# Patient Record
Sex: Female | Born: 1995 | Race: Black or African American | Hispanic: No | Marital: Single | Smoking: Never smoker
Health system: Southern US, Community
[De-identification: ages and names within clinical notes are randomized; demographics above are authoritative.]

## PROBLEM LIST (undated history)

## (undated) DIAGNOSIS — D573 Sickle-cell trait: Secondary | ICD-10-CM

## (undated) DIAGNOSIS — R569 Unspecified convulsions: Secondary | ICD-10-CM

## (undated) DIAGNOSIS — I10 Essential (primary) hypertension: Secondary | ICD-10-CM

## (undated) HISTORY — DX: Sickle-cell trait: D57.3

---
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
## 2015-01-04 ENCOUNTER — Ambulatory Visit (INDEPENDENT_AMBULATORY_CARE_PROVIDER_SITE_OTHER): Payer: Medicaid Other

## 2015-01-04 DIAGNOSIS — Z111 Encounter for screening for respiratory tuberculosis: Secondary | ICD-10-CM

## 2015-01-04 NOTE — Progress Notes (Signed)
Patient here today for TB skin test for work. PPD placed in right fore arm. Patient to return to clinic Friday 01/06/15 at 0800 to have it read.

## 2015-01-05 ENCOUNTER — Ambulatory Visit: Payer: Self-pay

## 2015-01-06 NOTE — Progress Notes (Signed)
Pt came in for PPD reading.  R forearm resulted with 0 mm induration, negative.   Filled out pt record, made copy, and placed in misc records to be scanned into EPIC.

## 2015-06-08 ENCOUNTER — Encounter: Payer: Self-pay | Admitting: *Deleted

## 2017-04-08 ENCOUNTER — Encounter (HOSPITAL_COMMUNITY): Payer: Self-pay | Admitting: *Deleted

## 2017-04-08 ENCOUNTER — Ambulatory Visit (HOSPITAL_COMMUNITY)
Admission: EM | Admit: 2017-04-08 | Discharge: 2017-04-08 | Disposition: A | Discharge status: Home / Self Care | Payer: Self-pay | Attending: Family Medicine | Admitting: Family Medicine

## 2017-04-08 DIAGNOSIS — D573 Sickle-cell trait: Secondary | ICD-10-CM | POA: Insufficient documentation

## 2017-04-08 DIAGNOSIS — Z79899 Other long term (current) drug therapy: Secondary | ICD-10-CM | POA: Insufficient documentation

## 2017-04-08 DIAGNOSIS — R109 Unspecified abdominal pain: Secondary | ICD-10-CM | POA: Insufficient documentation

## 2017-04-08 DIAGNOSIS — N898 Other specified noninflammatory disorders of vagina: Secondary | ICD-10-CM | POA: Insufficient documentation

## 2017-04-08 NOTE — Discharge Instructions (Signed)
No alarming symptoms on your exam. Take ibuprofen 800mg  three times a day or Naproxen 440mg  twice a day if experiencing cramping. Follow up with GYN for further evaluation and treatment for your abdominal cramps/nexplanon implant. Cytology sent, you will be contacted with any positive results that requires further treatment. Refrain from sexual activity for the next 7 days. Monitor for any worsening of symptoms, fever, abdominal pain, nausea, vomiting, to follow up for reevaluation.

## 2017-04-08 NOTE — ED Provider Notes (Signed)
[facility]    CSN: [id number] Arrival date & time: 04/08/17  1304     History   Chief Complaint Chief Complaint  Patient presents with  . Vaginal Discharge    HPI Valerie Brooks is a 21 y.o. female.   21 year old female with 8 day history of vaginal discharge. Patient states discharge has been alternating between white and yellow, with occasional odor. Denies vaginal itching/pain, spotting. Patient states she had similar symptoms in the past, will and was associated with hormone change with birth control. Denies urinary symptoms such as dysuria, hematuria, frequency. Patient states she has had intermittent abdominal pain that is associated with her cycles. She has been taking ibuprofen for the cramping with good control. LMP 02/01/2017. She has a nexplanon implant, and is due for extraction soon. She denies current abdominal pain, nausea, vomiting, diarrhea, constipation. She is not currently sexually active, last sexual activity a few months ago. At that time, one partner, some condom use. Denies fever, chills, night sweats.       Past Medical History:  Diagnosis Date  . Seizures (HCC)   . Sickle cell trait [facility])     Patient Active Problem List   Diagnosis Date Noted  . Active labor 07/14/2014  . Vaginal delivery 07/14/2014  . Spontaneous vaginal delivery 07/14/2014  . Insufficient weight gain during pregnancy in third trimester, antepartum   . Encounter for fetal anatomic survey   . Uterine size-date discrepancy in third trimester, antepartum 07/11/2014  . Hemoglobin S (Hb-S) trait (HCC) 06/07/2014  . Asymptomatic bacteriuria during pregnancy in third trimester 06/07/2014  . Supervision of normal pregnancy in first trimester 05/30/2014  . Short cervix affecting pregnancy 05/30/2014  . Late prenatal care 05/30/2014  . Teen pregnancy 05/30/2014    History reviewed. No pertinent surgical history.  OB History    Gravida Para Term Preterm AB Living   1  1 1     1    SAB TAB Ectopic Multiple Live Births         0 1       Home Medications    Prior to Admission medications   Medication Sig Start Date End Date Taking? Authorizing Provider  etonogestrel (NEXPLANON) 68 MG IMPL implant 1 each by Subdermal route once. Inserted 07/15/14    [provider]  ibuprofen (ADVIL,MOTRIN) 600 MG tablet Take 1 tablet (600 mg total) by mouth every 6 (six) hours. Patient not taking: Reported on 08/25/2014 07/16/14   Arabella Merles, CNM  Prenatal Vit-Fe Fumarate-FA (PRENATAL MULTIVITAMIN) TABS tablet Take 1 tablet by mouth daily at 12 noon.    [provider]    Family History Family History  Problem Relation Age of Onset  . Sickle cell trait Mother   . Sickle cell trait Sister     Social History Social History  Substance Use Topics  . Smoking status: Never Smoker  . Smokeless tobacco: Never Used  . Alcohol use No     Allergies   Other   Review of Systems Review of Systems  Reason unable to perform ROS: See HPI as above.     Physical Exam Triage Vital Signs ED Triage Vitals [04/08/17 1404]  Enc Vitals Group     BP 123/84     Pulse Rate 78     Resp 14     Temp 98.3 F (36.8 C)     Temp Source Oral     SpO2 100 %     Weight  Height      Head Circumference      Peak Flow      Pain Score      Pain Loc      Pain Edu?      Excl. in GC?    No data found.   Updated Vital Signs BP 123/84 (BP Location: Right Arm)   Pulse 78   Temp 98.3 F (36.8 C) (Oral)   Resp 14   LMP 02/01/2017   SpO2 100%   Breastfeeding? No Comment: irreg  periods   since  implant  Visual Acuity Right Eye Distance:   Left Eye Distance:   Bilateral Distance:    Right Eye Near:   Left Eye Near:    Bilateral Near:     Physical Exam  Constitutional: She is oriented to person, place, and time. She appears well-developed and well-nourished. No distress.  HENT:  Head: Normocephalic and atraumatic.  Eyes: Pupils are equal,  round, and reactive to light. Conjunctivae are normal.  Cardiovascular: Normal rate, regular rhythm and normal heart sounds.  Exam reveals no gallop and no friction rub.   No murmur heard. Pulmonary/Chest: Effort normal and breath sounds normal. She has no wheezes. She has no rales.  Abdominal: Soft. Bowel sounds are normal. She exhibits no mass. There is no tenderness. There is no rebound, no guarding and no CVA tenderness.  Genitourinary:  Genitourinary Comments: Deferred. Patient self swabbed for cytology.   Neurological: She is alert and oriented to person, place, and time.  Skin: Skin is warm and dry.  Psychiatric: She has a normal mood and affect. Her behavior is normal. Judgment normal.     UC Treatments / Results  Labs (all labs ordered are listed, but only abnormal results are displayed) Labs Reviewed  CERVICOVAGINAL ANCILLARY ONLY    EKG  EKG Interpretation None       Radiology No results found.  Procedures Procedures (including critical care time)  Medications Ordered in UC Medications - No data to display   Initial Impression / Assessment and Plan / UC Course  I have reviewed the triage vital signs and the nursing notes.  Pertinent labs & imaging results that were available during my care of the patient were reviewed by me and considered in my medical decision making (see chart for details).    Discussed with patient possible causes of vaginal discharge, such as infection, irritation, changes in hormone. Given without alarming signs on exam, will await cytology results for treatment. Cytology sent, patient will be contacted with any positive results that require additional treatment. Patient to refrain from sexual activity for the next 7 days. Patient to follow up with GYN for further evaluation and treatment of abdominal cramping with cycles, and birth control use. Return precautions given.   Final Clinical Impressions(s) / UC Diagnoses   Final diagnoses:    Vaginal discharge    New Prescriptions New Prescriptions   No medications on file       Lurline IdolYu, Amy V, PA-C 04/08/17 1448

## 2017-04-08 NOTE — ED Triage Notes (Signed)
Pt     Reports    Vaginal  Discharge   X  8  Days  Denies   Any  Symptoms     Pt   Denies  Any   Pain

## 2017-04-10 ENCOUNTER — Telehealth (HOSPITAL_COMMUNITY): Payer: Self-pay | Admitting: Internal Medicine

## 2017-04-10 LAB — CERVICOVAGINAL ANCILLARY ONLY
Bacterial vaginitis: POSITIVE — AB
CANDIDA VAGINITIS: NEGATIVE
CHLAMYDIA, DNA PROBE: NEGATIVE
Neisseria Gonorrhea: NEGATIVE
Trichomonas: NEGATIVE

## 2017-04-10 NOTE — Telephone Encounter (Signed)
Please let patient know that test for gardnerella (bacterial vaginosis) was positive.  This only needs to be treated if there are symptoms, such as vaginal irritation/discharge.  If these symptoms are present, ok to send rx for metronidazole 500mg bid x 7d #14 no refills or metronidazole vaginal gel 0.75% 1 applicatorful bid x 7d #14 no refills. Recheck for further evaluation if symptoms are not improving.  LM   

## 2017-04-14 ENCOUNTER — Encounter: Payer: Self-pay | Admitting: Family Medicine

## 2017-04-14 ENCOUNTER — Other Ambulatory Visit: Payer: Self-pay | Admitting: General Practice

## 2017-04-14 DIAGNOSIS — B9689 Other specified bacterial agents as the cause of diseases classified elsewhere: Secondary | ICD-10-CM

## 2017-04-14 DIAGNOSIS — N760 Acute vaginitis: Principal | ICD-10-CM

## 2017-04-14 MED ORDER — METRONIDAZOLE 500 MG PO TABS: 500.0000 mg | ORAL_TABLET | Freq: Two times a day (BID) | ORAL | 0 refills | Status: DC

## 2017-04-25 ENCOUNTER — Encounter: Payer: Self-pay | Admitting: Family Medicine

## 2017-04-25 ENCOUNTER — Ambulatory Visit (INDEPENDENT_AMBULATORY_CARE_PROVIDER_SITE_OTHER): Payer: Self-pay | Admitting: Family Medicine

## 2017-04-25 VITALS — BP 136/84 | HR 76 | Ht 60.0 in | Wt 96.4 lb

## 2017-04-25 DIAGNOSIS — R102 Pelvic and perineal pain: Secondary | ICD-10-CM

## 2017-04-25 NOTE — Progress Notes (Signed)
Valerie Brooks wants to get nexplanon removed because she thinks it is causing her pelvic pain. States has monthly pain , not always a monthly period and it is is getting worse each month.Denies any pain today   States nexplanon due to come out in December anyways. Wants to start depo. We discussed the costs for each and she states she is getting [employer] next week and she elected to reschedule after her insurance starts.

## 2017-05-01 ENCOUNTER — Encounter: Payer: Self-pay | Admitting: General Practice

## 2017-05-01 ENCOUNTER — Ambulatory Visit: Payer: Self-pay | Admitting: Family Medicine

## 2017-05-01 NOTE — Progress Notes (Unsigned)
Patient no showed for appt today. Per Dr Adrian Blackwater, patient can reschedule on her own accord

## 2017-06-06 ENCOUNTER — Encounter: Payer: Self-pay | Admitting: Family Medicine

## 2018-06-17 ENCOUNTER — Other Ambulatory Visit: Payer: Self-pay | Admitting: Obstetrics and Gynecology

## 2018-07-17 ENCOUNTER — Ambulatory Visit (HOSPITAL_BASED_OUTPATIENT_CLINIC_OR_DEPARTMENT_OTHER): Admit: 2018-07-17 | Payer: Self-pay | Admitting: Obstetrics and Gynecology

## 2018-07-17 ENCOUNTER — Encounter (HOSPITAL_BASED_OUTPATIENT_CLINIC_OR_DEPARTMENT_OTHER): Payer: Self-pay

## 2018-07-17 SURGERY — LAPAROSCOPY, DIAGNOSTIC
Anesthesia: Choice

## 2018-09-19 ENCOUNTER — Emergency Department (HOSPITAL_COMMUNITY): Payer: Self-pay

## 2018-09-19 ENCOUNTER — Encounter (HOSPITAL_COMMUNITY): Payer: Self-pay

## 2018-09-19 ENCOUNTER — Emergency Department (HOSPITAL_COMMUNITY)
Admission: EM | Admit: 2018-09-19 | Discharge: 2018-09-19 | Disposition: A | Discharge status: Home / Self Care | Payer: Self-pay | Attending: Emergency Medicine | Admitting: Emergency Medicine

## 2018-09-19 DIAGNOSIS — R202 Paresthesia of skin: Secondary | ICD-10-CM | POA: Insufficient documentation

## 2018-09-19 DIAGNOSIS — R1030 Lower abdominal pain, unspecified: Secondary | ICD-10-CM | POA: Insufficient documentation

## 2018-09-19 DIAGNOSIS — Z79899 Other long term (current) drug therapy: Secondary | ICD-10-CM | POA: Insufficient documentation

## 2018-09-19 LAB — COMPREHENSIVE METABOLIC PANEL
ALK PHOS: 52 U/L (ref 38–126)
ALT: 17 U/L (ref 0–44)
AST: 23 U/L (ref 15–41)
Albumin: 4.8 g/dL (ref 3.5–5.0)
Anion gap: 11 (ref 5–15)
BILIRUBIN TOTAL: 0.6 mg/dL (ref 0.3–1.2)
BUN: 9 mg/dL (ref 6–20)
CALCIUM: 9.8 mg/dL (ref 8.9–10.3)
CO2: 24 mmol/L (ref 22–32)
Chloride: 102 mmol/L (ref 98–111)
Creatinine, Ser: 0.78 mg/dL (ref 0.44–1.00)
GFR calc non Af Amer: >60 mL/min (ref >60)
Glucose, Bld: 90 mg/dL (ref 70–99)
Potassium: 3.5 mmol/L (ref 3.5–5.1)
Sodium: 137 mmol/L (ref 135–145)
TOTAL PROTEIN: 8.2 g/dL — AB (ref 6.5–8.1)

## 2018-09-19 LAB — CBC WITH DIFFERENTIAL/PLATELET
Abs Immature Granulocytes: 0 10*3/uL (ref 0.00–0.07)
BASOS ABS: 0 10*3/uL (ref 0.0–0.1)
Basophils Relative: 1 %
EOS ABS: 0.1 10*3/uL (ref 0.0–0.5)
Eosinophils Relative: 2 %
HEMATOCRIT: 41.2 % (ref 36.0–46.0)
Hemoglobin: 13.6 g/dL (ref 12.0–15.0)
IMMATURE GRANULOCYTES: 0 %
LYMPHS ABS: 3.1 10*3/uL (ref 0.7–4.0)
LYMPHS PCT: 51 %
MCH: 26.2 pg (ref 26.0–34.0)
MCHC: 33 g/dL (ref 30.0–36.0)
MCV: 79.4 fL — ABNORMAL LOW (ref 80.0–100.0)
Monocytes Absolute: 0.4 10*3/uL (ref 0.1–1.0)
Monocytes Relative: 7 %
NRBC: 0 % (ref 0.0–0.2)
Neutro Abs: 2.3 10*3/uL (ref 1.7–7.7)
Neutrophils Relative %: 39 %
Platelets: 283 10*3/uL (ref 150–400)
RBC: 5.19 MIL/uL — ABNORMAL HIGH (ref 3.87–5.11)
RDW: 13.3 % (ref 11.5–15.5)
WBC: 5.9 10*3/uL (ref 4.0–10.5)

## 2018-09-19 LAB — URINALYSIS, ROUTINE W REFLEX MICROSCOPIC
BILIRUBIN URINE: NEGATIVE
Glucose, UA: NEGATIVE mg/dL
Hgb urine dipstick: NEGATIVE
Ketones, ur: NEGATIVE mg/dL
Leukocytes,Ua: NEGATIVE
NITRITE: NEGATIVE
PH: 6 (ref 5.0–8.0)
PROTEIN: NEGATIVE mg/dL
Specific Gravity, Urine: 1.017 (ref 1.005–1.030)

## 2018-09-19 LAB — LIPASE, BLOOD: Lipase: 52 U/L — ABNORMAL HIGH (ref 11–51)

## 2018-09-19 LAB — POC URINE PREG, ED: Preg Test, Ur: NEGATIVE

## 2018-09-19 LAB — TSH: TSH: 0.612 u[IU]/mL (ref 0.350–4.500)

## 2018-09-19 MED ORDER — SODIUM CHLORIDE 0.9 % IV BOLUS: 1000.0000 mL | Freq: Once | INTRAVENOUS | Status: DC

## 2018-09-19 MED ORDER — IBUPROFEN 600 MG PO TABS: 600.0000 mg | ORAL_TABLET | Freq: Four times a day (QID) | ORAL | 0 refills | Status: DC | PRN

## 2018-09-19 NOTE — ED Triage Notes (Signed)
Pt reports intermittent right arm numbness for the past month and also bilateral legs at times. Pt denies any weakness. Pt states she just stopped taking her birth control last month (depo injections). Pt not taking any birth control at this time.

## 2018-09-19 NOTE — Discharge Instructions (Addendum)
You were seen in the emergency department for numbness in your right arm along with worsening of your chronic low abdominal pain along with feeling generally fatigued and shaky.  Your lab work did not show an obvious cause of your symptoms.  Your CAT scan of your head was also normal.  You should take ibuprofen every 6-8 hours as needed for pain and drink plenty of fluids.  It will be important for you to follow-up with a gynecologist to make giving you the number for 1 of the clinics.  Please return if any worsening symptoms.

## 2018-09-19 NOTE — ED Provider Notes (Signed)
[facility] EMERGENCY DEPARTMENT Provider Note   CSN: [id number] Arrival date & time: 09/19/18  1756     History   Chief Complaint Chief Complaint  Patient presents with  . Numbness    HPI Valerie Brooks is a 23 y.o. female.  She is presenting to the emergency department with multiple complaints.  #1 is numbness in her right hand and arm that started yesterday.  Is not associated with any clumsiness or pain.  Thing makes it better or worse.  She is also had intermittent weakness and shakiness of her legs.  She is also felt fatigued and hot and cold intermittently for a few weeks.  She also has chronic low abdominal pain that is gotten worse ever since she stopped getting her Depakote shot.  She was due for her Doppler shot in January.  No cough no chest pain no shortness of breath no vaginal bleeding or discharge.  She is sexually active.  The history is provided by the patient.  Cerebrovascular Accident  This is a new problem. The current episode started yesterday. The problem occurs constantly. The problem has not changed since onset.Associated symptoms include abdominal pain. Pertinent negatives include no chest pain, no headaches and no shortness of breath. Nothing aggravates the symptoms. Nothing relieves the symptoms. She has tried nothing for the symptoms. The treatment provided no relief.  Abdominal Pain  Pain location:  Suprapubic Pain quality: cramping   Pain radiates to:  Does not radiate Pain severity:  Moderate Onset quality:  Gradual Timing:  Intermittent Progression:  Worsening Chronicity:  Recurrent Context: not recent travel, not sick contacts and not trauma   Relieved by:  Nothing Worsened by:  Nothing Ineffective treatments:  None tried Associated symptoms: fatigue   Associated symptoms: no chest pain, no diarrhea, no dysuria, no fever, no hematuria, no nausea, no shortness of breath, no sore throat, no vaginal bleeding, no vaginal  discharge and no vomiting   Risk factors: not pregnant and no recent hospitalization     Past Medical History:  Diagnosis Date  . Seizures (HCC)   . Sickle cell trait [facility])     Patient Active Problem List   Diagnosis Date Noted  . Active labor 07/14/2014  . Vaginal delivery 07/14/2014  . Spontaneous vaginal delivery 07/14/2014  . Insufficient weight gain during pregnancy in third trimester, antepartum   . Encounter for fetal anatomic survey   . Uterine size-date discrepancy in third trimester, antepartum 07/11/2014  . Hemoglobin S (Hb-S) trait (HCC) 06/07/2014  . Asymptomatic bacteriuria during pregnancy in third trimester 06/07/2014  . Supervision of normal pregnancy in first trimester 05/30/2014  . Short cervix affecting pregnancy 05/30/2014  . Late prenatal care 05/30/2014  . Teen pregnancy 05/30/2014    History reviewed. No pertinent surgical history.   OB History    Gravida  1   Para  1   Term  1   Preterm      AB      Living  1     SAB      TAB      Ectopic      Multiple  0   Live Births  1            Home Medications    Prior to Admission medications   Medication Sig Start Date End Date Taking? Authorizing Provider  ibuprofen (ADVIL,MOTRIN) 600 MG tablet Take 1 tablet (600 mg total) by mouth every 6 (six) hours. Patient taking differently:  Take 600 mg by mouth every 6 (six) hours as needed for moderate pain.  07/16/14  Yes Arabella Merles, CNM  etonogestrel (NEXPLANON) 68 MG IMPL implant 1 each by Subdermal route once. Inserted 07/15/14    [provider]    Family History Family History  Problem Relation Age of Onset  . Sickle cell trait Mother   . Sickle cell trait Sister     Social History Social History   Tobacco Use  . Smoking status: Never Smoker  . Smokeless tobacco: Never Used  Substance Use Topics  . Alcohol use: No  . Drug use: No     Allergies   Orange fruit [citrus]   Review of Systems Review of  Systems  Constitutional: Positive for fatigue. Negative for fever.  HENT: Negative for sore throat.   Eyes: Negative for visual disturbance.  Respiratory: Negative for shortness of breath.   Cardiovascular: Negative for chest pain.  Gastrointestinal: Positive for abdominal pain. Negative for diarrhea, nausea and vomiting.  Genitourinary: Negative for dysuria, hematuria, vaginal bleeding, vaginal discharge and vaginal pain.  Musculoskeletal: Negative for back pain.  Skin: Negative for rash.  Neurological: Positive for dizziness, tremors and numbness. Negative for syncope, speech difficulty and headaches.     Physical Exam Updated Vital Signs BP (!) 132/98   Pulse 74   Temp 97.6 F (36.4 C) (Oral)   Resp 18   SpO2 100%   Physical Exam Vitals signs and nursing note reviewed.  Constitutional:      General: She is not in acute distress.    Appearance: She is well-developed.  HENT:     Head: Normocephalic and atraumatic.  Eyes:     Conjunctiva/sclera: Conjunctivae normal.  Neck:     Musculoskeletal: Neck supple.  Cardiovascular:     Rate and Rhythm: Normal rate and regular rhythm.     Heart sounds: No murmur.  Pulmonary:     Effort: Pulmonary effort is normal. No respiratory distress.     Breath sounds: Normal breath sounds.  Abdominal:     Palpations: Abdomen is soft.     Tenderness: There is no abdominal tenderness.  Musculoskeletal:        General: No deformity or signs of injury.     Right lower leg: No edema.     Left lower leg: No edema.  Skin:    General: Skin is warm and dry.     Capillary Refill: Capillary refill takes less than 2 seconds.  Neurological:     General: No focal deficit present.     Mental Status: She is alert and oriented to person, place, and time.     Cranial Nerves: No cranial nerve deficit.     Sensory: No sensory deficit.     Motor: No weakness.     Gait: Gait normal.      ED Treatments / Results  Labs (all labs ordered are listed,  but only abnormal results are displayed) Labs Reviewed  COMPREHENSIVE METABOLIC PANEL - Abnormal; Notable for the following components:      Result Value   Total Protein 8.2 (*)    All other components within normal limits  LIPASE, BLOOD - Abnormal; Notable for the following components:   Lipase 52 (*)    All other components within normal limits  CBC WITH DIFFERENTIAL/PLATELET - Abnormal; Notable for the following components:   RBC 5.19 (*)    MCV 79.4 (*)    All other components within normal limits  URINALYSIS,  ROUTINE W REFLEX MICROSCOPIC - Abnormal; Notable for the following components:   APPearance HAZY (*)    All other components within normal limits  TSH  POC URINE PREG, ED    EKG None  Radiology Ct Head Wo Contrast  Result Date: 09/19/2018 CLINICAL DATA:  RIGHT arm numbness, BILATERAL leg weakness, focal neural deficit of more than 6 hours suspected stroke, history of seizures, sickle trait EXAM: CT HEAD WITHOUT CONTRAST TECHNIQUE: Contiguous axial images were obtained from the base of the skull through the vertex without intravenous contrast. Sagittal and coronal MPR images reconstructed from axial data set. COMPARISON:  None FINDINGS: Brain: Normal ventricular morphology. No midline shift or mass effect. Normal appearance of brain parenchyma. No intracranial hemorrhage, mass lesion, evidence of acute infarction, or extra-axial fluid collection. Vascular: Unremarkable Skull: Intact Sinuses/Orbits: Clear Other: N/A IMPRESSION: Normal exam. Electronically Signed   By: Ulyses SouthwardMark  Boles M.D.   On: 09/19/2018 20:21    Procedures Procedures (including critical care time)  Medications Ordered in ED Medications  sodium chloride 0.9 % bolus 1,000 mL (has no administration in time range)     Initial Impression / Assessment and Plan / ED Course  I have reviewed the triage vital signs and the nursing notes.  Pertinent labs & imaging results that were available during my care of the  patient were reviewed by me and considered in my medical decision making (see chart for details).  Clinical Course as of Sep 19 2133  Sat Sep 19, 2018  2055 Patient here with what sounds like a lot of acute on chronic complaints.  She is benign exam normal vitals.  Her lab work-up is been unremarkable.  I recommended to her that we try to get her in with a gynecology clinic.  Ibuprofen for pain.   [MB]    Clinical Course User Index [MB] Terrilee FilesButler, Liliani Bobo C, MD     Final Clinical Impressions(s) / ED Diagnoses   Final diagnoses:  Paresthesias  Lower abdominal pain    ED Discharge Orders         Ordered    ibuprofen (ADVIL,MOTRIN) 600 MG tablet  Every 6 hours PRN     09/19/18 2057           Terrilee FilesButler, Ziaire Bieser C, MD 09/19/18 2135

## 2018-12-19 ENCOUNTER — Encounter (HOSPITAL_COMMUNITY): Payer: Self-pay | Admitting: Emergency Medicine

## 2018-12-19 ENCOUNTER — Ambulatory Visit (HOSPITAL_COMMUNITY)
Admission: EM | Admit: 2018-12-19 | Discharge: 2018-12-19 | Disposition: A | Discharge status: Home / Self Care | Payer: Medicaid Other | Attending: Family Medicine | Admitting: Family Medicine

## 2018-12-19 ENCOUNTER — Other Ambulatory Visit: Payer: Self-pay

## 2018-12-19 DIAGNOSIS — Z3201 Encounter for pregnancy test, result positive: Secondary | ICD-10-CM

## 2018-12-19 LAB — POCT PREGNANCY, URINE: Preg Test, Ur: POSITIVE — AB

## 2018-12-19 NOTE — Discharge Instructions (Signed)
Follow up with OBGYN Begin prenatal vitamin Read attached information on prenatal care  Nausea: One-half of the 25 mg Unisom sleep tablet over-the-counter tablet or two chewable 5 mg tablets can be used off-label as an antiemetic. In addition, pyridoxine 25 mg, also available over-the-counter, is taken three or four times per day;This is a reasonable, less expensive substitute for combination tablets.  Follow up in emergency room/[facility] hospital if developing abdominal pain or bleeding

## 2018-12-19 NOTE — ED Triage Notes (Signed)
Pt took pregnancy test at home that was positive, wanting verification.

## 2018-12-19 NOTE — ED Notes (Signed)
Patient able to ambulate independently  

## 2018-12-19 NOTE — ED Provider Notes (Signed)
[facility]    CSN: [id number] Arrival date & time: 12/19/18  1216     History   Chief Complaint Chief Complaint  Patient presents with  . Possible Pregnancy    HPI Valerie Brooks is a 23 y.o. female no contributing past medical history presenting today for evaluation for pregnancy test.  Patient states that over the past week she has felt more fatigued.  She took a pregnancy test last week which was negative, took one yesterday which was positive.  Presenting today for confirmation.  Patient is unsure of exactly when her last menstrual cycle was.  Patient has been on Depo, but last injection was due in January which she did not receive.  She believes she may have had some bleeding in February, but it was not a full menstrual cycle.  She denies any abdominal pain, abnormal bleeding.  Denies any nausea or vomiting.  Previously was [facility] for previous [facility].  HPI  Past Medical History:  Diagnosis Date  . Seizures (HCC)   . Sickle cell trait [facility](HCC)     Patient Active Problem List   Diagnosis Date Noted  . Active labor 07/14/2014  . Vaginal delivery 07/14/2014  . Spontaneous vaginal delivery 07/14/2014  . Insufficient weight gain during pregnancy in third trimester, antepartum   . Encounter for fetal anatomic survey   . Uterine size-date discrepancy in third trimester, antepartum 07/11/2014  . Hemoglobin S (Hb-S) trait (HCC) 06/07/2014  . Asymptomatic bacteriuria during pregnancy in third trimester 06/07/2014  . Supervision of normal pregnancy in first trimester 05/30/2014  . Short cervix affecting pregnancy 05/30/2014  . Late prenatal care 05/30/2014  . Teen pregnancy 05/30/2014    History reviewed. No pertinent surgical history.  OB History    Gravida  1   Para  1   Term  1   Preterm      AB      Living  1     SAB      TAB      Ectopic      Multiple  0   Live Births  1            Home Medications    Prior to Admission medications   Medication Sig Start Date End Date Taking? Authorizing Provider  cholecalciferol (VITAMIN D3) 25 MCG (1000 UT) tablet Take 1,000 Units by mouth daily.    [provider]  ibuprofen (ADVIL,MOTRIN) 600 MG tablet Take 1 tablet (600 mg total) by mouth every 6 (six) hours as needed for moderate pain. 09/19/18   Terrilee FilesButler, Michael C, MD  LYSINE PO Take 1 tablet by mouth daily.    [provider]  Probiotic Product (PROBIOTIC-10 PO) Take 1 tablet by mouth daily.    [provider]  vitamin B-12 (CYANOCOBALAMIN) 500 MCG tablet Take 500 mcg by mouth daily.    [provider]  vitamin E 100 UNIT capsule Take 100 Units by mouth daily.    [provider]    Family History Family History  Problem Relation Age of Onset  . Sickle cell trait Mother   . Sickle cell trait Sister     Social History Social History   Tobacco Use  . Smoking status: Never Smoker  . Smokeless tobacco: Never Used  Substance Use Topics  . Alcohol use: No  . Drug use: No     Allergies   Orange fruit [citrus]   Review of Systems Review  of Systems  Constitutional: Positive for fatigue. Negative for fever.  Respiratory: Negative for shortness of breath.   Cardiovascular: Negative for chest pain.  Gastrointestinal: Negative for abdominal pain, diarrhea, nausea and vomiting.  Genitourinary: Negative for dysuria, flank pain, genital sores, hematuria, menstrual problem, vaginal bleeding, vaginal discharge and vaginal pain.  Musculoskeletal: Negative for back pain.  Skin: Negative for rash.  Neurological: Negative for dizziness, light-headedness and headaches.     Physical Exam Triage Vital Signs ED Triage Vitals  Enc Vitals Group     BP 12/19/18 1235 122/73     Pulse Rate 12/19/18 1235 73     Resp 12/19/18 1235 16     Temp 12/19/18 1235 98.2 F (36.8 C)     Temp Source 12/19/18 1235 Oral     SpO2 12/19/18 1235 100 %     Weight --       Height --      Head Circumference --      Peak Flow --      Pain Score 12/19/18 1234 0     Pain Loc --      Pain Edu? --      Excl. in GC? --    No data found.  Updated Vital Signs BP 122/73 (BP Location: Left Arm)   Pulse 73   Temp 98.2 F (36.8 C) (Oral)   Resp 16   SpO2 100%   Visual Acuity Right Eye Distance:   Left Eye Distance:   Bilateral Distance:    Right Eye Near:   Left Eye Near:    Bilateral Near:     Physical Exam Vitals signs and nursing note reviewed.  Constitutional:      Appearance: She is well-developed.     Comments: No acute distress  HENT:     Head: Normocephalic and atraumatic.     Nose: Nose normal.  Eyes:     Conjunctiva/sclera: Conjunctivae normal.  Neck:     Musculoskeletal: Neck supple.  Cardiovascular:     Rate and Rhythm: Normal rate.  Pulmonary:     Effort: Pulmonary effort is normal. No respiratory distress.  Abdominal:     General: There is no distension.     Comments: Soft, nondistended, nontender to light and palpation throughout entire abdomen  Genitourinary:    Comments: Deferred Musculoskeletal: Normal range of motion.  Skin:    General: Skin is warm and dry.  Neurological:     Mental Status: She is alert and oriented to person, place, and time.      UC Treatments / Results  Labs (all labs ordered are listed, but only abnormal results are displayed) Labs Reviewed  POCT PREGNANCY, URINE - Abnormal; Notable for the following components:      Result Value   Preg Test, Ur POSITIVE (*)    All other components within normal limits  POC URINE PREG, ED    EKG None  Radiology No results found.  Procedures Procedures (including critical care time)  Medications Ordered in UC Medications - No data to display  Initial Impression / Assessment and Plan / UC Course  I have reviewed the triage vital signs and the nursing notes.  Pertinent labs & imaging results that were available during my care of the patient were  reviewed by me and considered in my medical decision making (see chart for details).    Pregnancy test positive.  Will have patient follow-up with OB/GYN for further management.  Advised to initiate prenatal vitamin.  Discussed  over-the-counter medicine she may use to help with any nausea she may develop.  Provided information on prenatal care.Discussed strict return precautions. Patient verbalized understanding and is agreeable with plan.  Final Clinical Impressions(s) / UC Diagnoses   Final diagnoses:  Positive pregnancy test     Discharge Instructions     Follow up with OBGYN Begin prenatal vitamin Read attached information on prenatal care  Nausea: One-half of the 25 mg Unisom sleep tablet over-the-counter tablet or two chewable 5 mg tablets can be used off-label as an antiemetic. In addition, pyridoxine 25 mg, also available over-the-counter, is taken three or four times per day;This is a reasonable, less expensive substitute for combination tablets.  Follow up in emergency room/women's hospital if developing abdominal pain or bleeding   ED Prescriptions    None     Controlled Substance Prescriptions [place] Controlled Substance Registry consulted? Not Applicable   Lew Dawes, [place] 12/19/18 1305

## 2018-12-23 ENCOUNTER — Other Ambulatory Visit: Payer: Self-pay

## 2018-12-23 ENCOUNTER — Telehealth (INDEPENDENT_AMBULATORY_CARE_PROVIDER_SITE_OTHER): Payer: Self-pay | Admitting: *Deleted

## 2018-12-23 DIAGNOSIS — O3680X0 Pregnancy with inconclusive fetal viability, not applicable or unspecified: Secondary | ICD-10-CM

## 2018-12-23 NOTE — Progress Notes (Signed)
I connected with  Valerie Brooks on 12/23/18 at  8:15 AM EDT by telephone and verified that I am speaking with the correct person using two identifiers.   I discussed the limitations, risks, security and privacy concerns of performing an evaluation and management service by telephone and the availability of in person appointments. I also discussed with the patient that there may be a patient responsible charge related to this service. The patient expressed understanding and agreed to proceed.  Osvaldo Human, RN 12/23/2018  8:25 AM   Pt states she was on birth control and hasn't had a period since she stopped taking the birth control in January. Pt states that she had the nexplanon removed in December and received a depo injection and then skipped it in January because she was having a lot of cramping. Reviewed with Dr. Vergie Living who requested pt come in for a non-stat bhcg. And then based on those results will determine when ultrasound is done.  Pt states she can come in early on 12/23/18.  Message sent to front office to schedule.

## 2018-12-23 NOTE — Progress Notes (Signed)
I have reviewed the chart and agree with nursing staff's documentation of this patient's encounter.  Cascade Bing, MD 12/23/2018 9:22 AM

## 2018-12-24 ENCOUNTER — Other Ambulatory Visit: Payer: Medicaid Other

## 2018-12-24 ENCOUNTER — Other Ambulatory Visit: Payer: Self-pay

## 2018-12-24 DIAGNOSIS — O3680X0 Pregnancy with inconclusive fetal viability, not applicable or unspecified: Secondary | ICD-10-CM

## 2018-12-25 LAB — BETA HCG QUANT (REF LAB): hCG Quant: 11451 m[IU]/mL

## 2018-12-27 ENCOUNTER — Other Ambulatory Visit: Payer: Self-pay

## 2018-12-27 ENCOUNTER — Inpatient Hospital Stay (HOSPITAL_COMMUNITY)
Admission: AD | Admit: 2018-12-27 | Discharge: 2018-12-27 | Disposition: A | Discharge status: Home / Self Care | Payer: BC Managed Care – PPO | Attending: Obstetrics & Gynecology | Admitting: Obstetrics & Gynecology

## 2018-12-27 ENCOUNTER — Encounter (HOSPITAL_COMMUNITY): Payer: Self-pay

## 2018-12-27 ENCOUNTER — Inpatient Hospital Stay (HOSPITAL_COMMUNITY): Payer: BC Managed Care – PPO

## 2018-12-27 DIAGNOSIS — D573 Sickle-cell trait: Secondary | ICD-10-CM | POA: Insufficient documentation

## 2018-12-27 DIAGNOSIS — Z79899 Other long term (current) drug therapy: Secondary | ICD-10-CM | POA: Diagnosis not present

## 2018-12-27 DIAGNOSIS — O23591 Infection of other part of genital tract in pregnancy, first trimester: Secondary | ICD-10-CM | POA: Insufficient documentation

## 2018-12-27 DIAGNOSIS — O26891 Other specified pregnancy related conditions, first trimester: Secondary | ICD-10-CM | POA: Insufficient documentation

## 2018-12-27 DIAGNOSIS — R109 Unspecified abdominal pain: Secondary | ICD-10-CM | POA: Insufficient documentation

## 2018-12-27 DIAGNOSIS — O418X90 Other specified disorders of amniotic fluid and membranes, unspecified trimester, not applicable or unspecified: Secondary | ICD-10-CM | POA: Diagnosis present

## 2018-12-27 DIAGNOSIS — Z3A01 Less than 8 weeks gestation of pregnancy: Secondary | ICD-10-CM | POA: Diagnosis not present

## 2018-12-27 DIAGNOSIS — O468X1 Other antepartum hemorrhage, first trimester: Secondary | ICD-10-CM

## 2018-12-27 DIAGNOSIS — N760 Acute vaginitis: Secondary | ICD-10-CM | POA: Diagnosis present

## 2018-12-27 DIAGNOSIS — O208 Other hemorrhage in early pregnancy: Secondary | ICD-10-CM | POA: Insufficient documentation

## 2018-12-27 DIAGNOSIS — O418X10 Other specified disorders of amniotic fluid and membranes, first trimester, not applicable or unspecified: Secondary | ICD-10-CM | POA: Insufficient documentation

## 2018-12-27 DIAGNOSIS — O26899 Other specified pregnancy related conditions, unspecified trimester: Secondary | ICD-10-CM

## 2018-12-27 DIAGNOSIS — B9689 Other specified bacterial agents as the cause of diseases classified elsewhere: Secondary | ICD-10-CM

## 2018-12-27 DIAGNOSIS — Z3490 Encounter for supervision of normal pregnancy, unspecified, unspecified trimester: Secondary | ICD-10-CM

## 2018-12-27 DIAGNOSIS — O468X9 Other antepartum hemorrhage, unspecified trimester: Secondary | ICD-10-CM | POA: Diagnosis present

## 2018-12-27 LAB — URINALYSIS, ROUTINE W REFLEX MICROSCOPIC
Bilirubin Urine: NEGATIVE
Glucose, UA: NEGATIVE mg/dL
Hgb urine dipstick: NEGATIVE
Ketones, ur: 20 mg/dL — AB
Nitrite: NEGATIVE
Protein, ur: NEGATIVE mg/dL
Specific Gravity, Urine: 1.011 (ref 1.005–1.030)
pH: 7 (ref 5.0–8.0)

## 2018-12-27 LAB — CBC
HCT: 36.2 % (ref 36.0–46.0)
Hemoglobin: 12.7 g/dL (ref 12.0–15.0)
MCH: 27.5 pg (ref 26.0–34.0)
MCHC: 35.1 g/dL (ref 30.0–36.0)
MCV: 78.4 fL — ABNORMAL LOW (ref 80.0–100.0)
Platelets: 265 10*3/uL (ref 150–400)
RBC: 4.62 MIL/uL (ref 3.87–5.11)
RDW: 12.7 % (ref 11.5–15.5)
WBC: 6.4 10*3/uL (ref 4.0–10.5)
nRBC: 0 % (ref 0.0–0.2)

## 2018-12-27 LAB — WET PREP, GENITAL
Sperm: NONE SEEN
Trich, Wet Prep: NONE SEEN
Yeast Wet Prep HPF POC: NONE SEEN

## 2018-12-27 MED ORDER — PROMETHAZINE HCL 12.5 MG PO TABS: 12.5000 mg | ORAL_TABLET | Freq: Four times a day (QID) | ORAL | 0 refills | Status: DC | PRN

## 2018-12-27 MED ORDER — METRONIDAZOLE 500 MG PO TABS: 500.0000 mg | ORAL_TABLET | Freq: Two times a day (BID) | ORAL | 0 refills | Status: AC

## 2018-12-27 NOTE — MAU Provider Note (Signed)
History     CSN: [id number]  Arrival date and time: 12/27/18 1614   First Provider Initiated Contact with Patient 12/27/18 1720      Chief Complaint  Patient presents with  . Abdominal Pain  . Vaginal Bleeding   HPI  Ms.  Valerie Brooks is a 23 y.o. year old G29P1001 female at 5.[redacted] weeks gestation by today's U/S who presents to MAU reporting abdominal pain/cramping that radiates to the lower back and spotting that started 12/26/2018. She denies any recent SI or abnormal vaginal discharge. She was seen at [facility] for no period after ending Nexplanon and found to be pregnant. Per the lab results her HCG on 12/24/2018 was 11,451.   Past Medical History:  Diagnosis Date  . Seizures (HCC)   . Sickle cell trait (HCC)     History reviewed. No pertinent surgical history.  Family History  Problem Relation Age of Onset  . Sickle cell trait Mother   . Sickle cell trait Sister     Social History   Tobacco Use  . Smoking status: Never Smoker  . Smokeless tobacco: Never Used  Substance Use Topics  . Alcohol use: No  . Drug use: No    Allergies:  Allergies  Allergen Reactions  . Orange Fruit [Citrus] Swelling and Rash    Medications Prior to Admission  Medication Sig Dispense Refill Last Dose  . cholecalciferol (VITAMIN D3) 25 MCG (1000 UT) tablet Take 1,000 Units by mouth daily.   Taking  . Prenatal Vit-Fe Fumarate-FA (PRENATAL PO) Take by mouth.   Taking  . Probiotic Product (PROBIOTIC-10 PO) Take 1 tablet by mouth daily.   Taking  . vitamin B-12 (CYANOCOBALAMIN) 500 MCG tablet Take 500 mcg by mouth daily.   Taking  . vitamin E 100 UNIT capsule Take 100 Units by mouth daily.   Taking    Review of Systems  Constitutional: Negative.   HENT: Negative.   Eyes: Negative.   Respiratory: Negative.   Cardiovascular: Negative.   Gastrointestinal: Negative.   Endocrine: Negative.   Genitourinary: Positive for pelvic pain (cramping) and vaginal bleeding (spotting).   Musculoskeletal: Negative.   Skin: Negative.   Allergic/Immunologic: Negative.   Neurological: Negative.   Hematological: Negative.   Psychiatric/Behavioral: Negative.    Physical Exam   Blood pressure 107/64, pulse 81, temperature 98.3 F (36.8 C), temperature source Oral, resp. rate 16, height 5' (1.524 m), weight 45.4 kg, SpO2 100 %.  Physical Exam  Nursing note and vitals reviewed. Constitutional: She is oriented to person, place, and time. She appears well-developed and well-nourished.  HENT:  Head: Normocephalic and atraumatic.  Eyes: Pupils are equal, round, and reactive to light.  Neck: Normal range of motion.  Cardiovascular: Normal rate.  Respiratory: Effort normal.  GI: Soft.  Genitourinary:    Genitourinary Comments: Uterus: non-tender, SE: cervix is smooth, pink, no lesions, scant amt of dark, brown blood d/c -- WP, GC/CT done, closed/long/firm, no CMT or friability, no adnexal tenderness    Musculoskeletal: Normal range of motion.  Neurological: She is alert and oriented to person, place, and time.  Skin: Skin is warm and dry.  Psychiatric: She has a normal mood and affect. Her behavior is normal. Judgment and thought content normal.    MAU Course  Procedures  MDM CCUA UPT CBC ABO/Rh -- not drawn, known O POS HCG -- not drawn, known 11,451 on 12/24/18 Wet Prep GC/CT -- pending HIV -- pending OB < 14 wks [place] with TV  Results for orders placed or performed during the hospital encounter of 12/27/18 (from the past 24 hour(s))  Urinalysis, Routine w reflex microscopic     Status: Abnormal   Collection Time: 12/27/18  4:54 PM  Result Value Ref Range   Color, Urine YELLOW YELLOW   APPearance CLEAR CLEAR   Specific Gravity, Urine 1.011 1.005 - 1.030   pH 7.0 5.0 - 8.0   Glucose, UA NEGATIVE NEGATIVE mg/dL   Hgb urine dipstick NEGATIVE NEGATIVE   Bilirubin Urine NEGATIVE NEGATIVE   Ketones, ur 20 (A) NEGATIVE mg/dL   Protein, ur NEGATIVE NEGATIVE mg/dL    Nitrite NEGATIVE NEGATIVE   Leukocytes,Ua SMALL (A) NEGATIVE   RBC / HPF 0-5 0 - 5 RBC/hpf   WBC, UA 0-5 0 - 5 WBC/hpf   Bacteria, UA RARE (A) NONE SEEN   Squamous Epithelial / LPF 6-10 0 - 5  Wet prep, genital     Status: Abnormal   Collection Time: 12/27/18  5:33 PM  Result Value Ref Range   Yeast Wet Prep HPF POC NONE SEEN NONE SEEN   Trich, Wet Prep NONE SEEN NONE SEEN   Clue Cells Wet Prep HPF POC PRESENT (A) NONE SEEN   WBC, Wet Prep HPF POC MANY (A) NONE SEEN   Sperm NONE SEEN   CBC     Status: Abnormal   Collection Time: 12/27/18  5:42 PM  Result Value Ref Range   WBC 6.4 4.0 - 10.5 K/uL   RBC 4.62 3.87 - 5.11 MIL/uL   Hemoglobin 12.7 12.0 - 15.0 g/dL   HCT 96.0 45.4 - 09.8 %   MCV 78.4 (L) 80.0 - 100.0 fL   MCH 27.5 26.0 - 34.0 pg   MCHC 35.1 30.0 - 36.0 g/dL   RDW 11.9 14.7 - 82.9 %   Platelets 265 150 - 400 K/uL   nRBC 0.0 0.0 - 0.2 %    US Ob Less Than 14 Weeks With Ob Transvaginal  Result Date: 12/27/2018 CLINICAL DATA:  Abdominal pain and vaginal spotting. Positive pregnancy test. EXAM: OBSTETRIC <14 WK [place] AND TRANSVAGINAL OB US TECHNIQUE: Both transabdominal and transvaginal ultrasound examinations were performed for complete evaluation of the gestation as well as the maternal uterus, adnexal regions, and pelvic cul-de-sac. Transvaginal technique was performed to assess early pregnancy. COMPARISON:  None. FINDINGS: Intrauterine gestational sac: Single. Yolk sac:  Visualized. Embryo:  Visualized. Cardiac Activity: Visualized. Heart Rate: 95 bpm CRL: 2.1 mm   5 w   5 d                  [place] EDC: 08/23/2018 Subchorionic hemorrhage:  Small Maternal uterus/adnexae: Corpus luteum cyst identified left ovary. Probable trace fluid in the cul-de-sac. IMPRESSION: Single living intrauterine gestation with crown-rump length estimating 5 week 5 day gestational age. Electronically Signed   By: Kennith Center M.D.   On: 12/27/2018 18:46    Assessment and Plan  Bacterial vaginosis - Rx  Flagyl 500 mg BID x 7 days - Information provided on BV   Abdominal pain affecting pregnancy  - Advised to take Tylenol 100 mg every 6 hrs prn pain - Information provided on abd pain in preg   Subchorionic hematoma in first trimester, single or unspecified fetus - Information provided on [facility]   Intrauterine pregnancy  - Establsih PNC by 10-12 wks   - Discharge patient - Patient verbalized an understanding of the plan of care and agrees.     Raelyn Mora, MSN, CNM  12/27/2018, 5:20 PM

## 2018-12-27 NOTE — MAU Note (Signed)
Pt reports abd cramping that radiates to her lower back and spotting since yesterday.  Pt denies recent intercourse.  Pt denies vag dc

## 2018-12-27 NOTE — Discharge Instructions (Signed)

## 2018-12-28 LAB — HIV ANTIBODY (ROUTINE TESTING W REFLEX): HIV Screen 4th Generation wRfx: NONREACTIVE

## 2018-12-29 ENCOUNTER — Encounter: Payer: Medicaid Other | Admitting: Obstetrics & Gynecology

## 2018-12-29 ENCOUNTER — Telehealth (INDEPENDENT_AMBULATORY_CARE_PROVIDER_SITE_OTHER): Payer: Self-pay | Admitting: Student

## 2018-12-29 DIAGNOSIS — O368390 Maternal care for abnormalities of the fetal heart rate or rhythm, unspecified trimester, not applicable or unspecified: Secondary | ICD-10-CM

## 2018-12-29 LAB — GC/CHLAMYDIA PROBE AMP (~~LOC~~) NOT AT ARMC
Chlamydia: NEGATIVE
Neisseria Gonorrhea: NEGATIVE

## 2018-12-29 NOTE — Telephone Encounter (Signed)
The patient stated she was told she needed an ultrasound at her recent ER visit. She stated she would like someone to take a look into it for her.

## 2018-12-29 NOTE — Telephone Encounter (Signed)
Called and scheduled follow up US on June 4 at 0800 with Ethelene Browns in scheduling.   Called and spoke with pt to inform her an [place] has been scheduled for 6/4 @ 0800 and she needs to report 15 minutes earlier. Informed pt to come to [place] office second floor [address]. Pt voiced understanding.   Pt reports she is feeling well with no concerns today.

## 2018-12-29 NOTE — Addendum Note (Signed)
Addended by: Ed Blalock on: 12/29/2018 10:52 AM   Modules accepted: Orders

## 2018-12-29 NOTE — Progress Notes (Signed)
I have reviewed the chart and agree with nursing staff's documentation of this patient's encounter.  Scheryl Darter, MD 12/29/2018 12:09 PM

## 2019-01-07 ENCOUNTER — Other Ambulatory Visit: Payer: Self-pay

## 2019-01-07 ENCOUNTER — Ambulatory Visit (HOSPITAL_COMMUNITY)
Admission: RE | Admit: 2019-01-07 | Discharge: 2019-01-07 | Disposition: A | Discharge status: Home / Self Care | Payer: Medicaid Other | Source: Ambulatory Visit | Attending: Obstetrics & Gynecology | Admitting: Obstetrics & Gynecology

## 2019-01-07 ENCOUNTER — Ambulatory Visit (INDEPENDENT_AMBULATORY_CARE_PROVIDER_SITE_OTHER): Payer: Self-pay

## 2019-01-07 DIAGNOSIS — Z712 Person consulting for explanation of examination or test findings: Secondary | ICD-10-CM

## 2019-01-07 DIAGNOSIS — O368390 Maternal care for abnormalities of the fetal heart rate or rhythm, unspecified trimester, not applicable or unspecified: Secondary | ICD-10-CM | POA: Insufficient documentation

## 2019-01-07 NOTE — Progress Notes (Signed)
Patient ID: Valerie Brooks, female   DOB: 09/23/1995, 23 y.o.   MRN: [id number] I have reviewed the chart and agree with nursing staff's documentation of this patient's encounter.  Scheryl Darter, MD 01/07/2019 4:50 PM

## 2019-01-07 NOTE — Progress Notes (Signed)
Pt here today for OB US results for fetal bradycardia.  Pt informed that baby looks good and FHR is 144.  Pt informed that she will receive proof of pregnancy letter from the front office to start prenatal care.    Addison Naegeli, RN 01/07/19

## 2019-01-18 ENCOUNTER — Telehealth: Payer: Self-pay | Admitting: Obstetrics and Gynecology

## 2019-01-18 NOTE — Telephone Encounter (Signed)
Patient called, and was instructed about wearing a mask for their entire visit. She was screened for COVID-19 symptoms and denied any symptoms. Instructed to use the provided hand sanitizer when entering building.

## 2019-01-19 ENCOUNTER — Encounter: Payer: Self-pay | Admitting: Obstetrics and Gynecology

## 2019-01-19 ENCOUNTER — Other Ambulatory Visit: Payer: Self-pay

## 2019-01-19 ENCOUNTER — Ambulatory Visit (INDEPENDENT_AMBULATORY_CARE_PROVIDER_SITE_OTHER): Payer: Medicaid Other | Admitting: Obstetrics and Gynecology

## 2019-01-19 ENCOUNTER — Other Ambulatory Visit (HOSPITAL_COMMUNITY)
Admission: RE | Admit: 2019-01-19 | Discharge: 2019-01-19 | Disposition: A | Discharge status: Home / Self Care | Payer: BC Managed Care – PPO | Source: Ambulatory Visit | Attending: Obstetrics & Gynecology | Admitting: Obstetrics & Gynecology

## 2019-01-19 VITALS — BP 98/60 | HR 70 | Temp 98.4°F | Wt 97.4 lb

## 2019-01-19 DIAGNOSIS — Z3480 Encounter for supervision of other normal pregnancy, unspecified trimester: Secondary | ICD-10-CM | POA: Insufficient documentation

## 2019-01-19 DIAGNOSIS — Z3481 Encounter for supervision of other normal pregnancy, first trimester: Secondary | ICD-10-CM

## 2019-01-19 DIAGNOSIS — R569 Unspecified convulsions: Secondary | ICD-10-CM

## 2019-01-19 DIAGNOSIS — Z3A09 9 weeks gestation of pregnancy: Secondary | ICD-10-CM | POA: Diagnosis not present

## 2019-01-19 LAB — POCT URINALYSIS DIP (DEVICE)
Bilirubin Urine: NEGATIVE
Glucose, UA: NEGATIVE mg/dL
Ketones, ur: NEGATIVE mg/dL
Leukocytes,Ua: NEGATIVE
Nitrite: NEGATIVE
Protein, ur: NEGATIVE mg/dL
Specific Gravity, Urine: 1.02 (ref 1.005–1.030)
Urobilinogen, UA: 0.2 mg/dL (ref 0.0–1.0)
pH: 7.5 (ref 5.0–8.0)

## 2019-01-19 MED ORDER — AMBULATORY NON FORMULARY MEDICATION: 1.0000 | 0 refills | Status: DC

## 2019-01-19 NOTE — Progress Notes (Signed)
INITIAL PRENATAL VISIT NOTE  Subjective:  Valerie Brooks is a 23 y.o. G2P1001 at 38w0dby 7 weeks [place] seen today for her initial prenatal visit. This is an unplanned pregnancy. She and partner are happy with the pregnancy. She was using nothing for birth control, was on depo previously. She has an obstetric history significant for 1TSVD. She has a medical history significant for seizures as a child and early teen, has not had a seizure since age 23 reports she was never given specific diagnosis. Thinks they were stress related as she hsa not had one [place]  Patient reports occasional spotting.  Contractions: Not present. Vag. Bleeding: None.  Movement: Absent. Denies leaking of fluid.    Past Medical History:  Diagnosis Date  . Seizures ([place]   . Sickle cell trait ([place]    History reviewed. No pertinent surgical history.  OB History  Gravida Para Term Preterm AB Living  '2 1 1     1  ' SAB TAB Ectopic Multiple Live Births        0 1    # Outcome Date GA Lbr Len/2nd Weight Sex Delivery Anes PTL Lv  2 Current           1 Term 07/14/14 [id number] / 00:15 6 lb 1 oz (2.75 kg) F Vag-Spont Local  LIV     Birth Comments: No anomalies noted    Social History   Socioeconomic History  . Marital status: Single    Spouse name: Not on file  . Number of children: Not on file  . Years of education: Not on file  . Highest education level: Not on file  Occupational History  . Not on file  Social Needs  . Financial resource strain: Patient refused  . Food insecurity    Worry: Patient refused    Inability: Patient refused  . Transportation needs    Medical: No    Non-medical: No  Tobacco Use  . Smoking status: Never Smoker  . Smokeless tobacco: Never Used  Substance and Sexual Activity  . Alcohol use: No  . Drug use: No  . Sexual activity: Yes    Birth control/protection: None  Lifestyle  . Physical activity    Days per week: Not on file    Minutes  per session: Not on file  . Stress: Not on file  Relationships  . Social coHerbalistn phone: Not on file    Gets together: Not on file    Attends religious service: Not on file    Active member of club or organization: Not on file    Attends meetings of clubs or organizations: Not on file    Relationship status: Not on file  Other Topics Concern  . Not on file  Social History Narrative  . Not on file   Family History  Problem Relation Age of Onset  . Sickle cell trait Mother   . Sickle cell trait Sister     Current Outpatient Medications:  .  Prenatal Vit-Fe Fumarate-FA (PRENATAL PO), Take by mouth., Disp: , Rfl:  .  Probiotic Product (PROBIOTIC-10 PO), Take 1 tablet by mouth daily., Disp: , Rfl:  .  AMBULATORY NON FORMULARY MEDICATION, 1 Device by Other route once a week. Blood Pressure Cuff Small Monitored Regularly at home ICD 10:Z34.90, Disp: 1 kit, Rfl: 0 .  promethazine (PHENERGAN) 12.5 MG tablet, Take 1 tablet (12.5 mg total) by mouth every 6 (six)  hours as needed for nausea or vomiting. Can also insert vaginally, if not able to keep anything down. Take 1 hr prior to Flagyl (Patient not taking: Reported on 01/19/2019), Disp: 30 tablet, Rfl: 0  Allergies  Allergen Reactions  . Orange Fruit [Citrus] Swelling and Rash   Review of Systems: Negative except for what is mentioned in HPI.  Objective:   Vitals:   01/19/19 0854  BP: 98/60  Pulse: 70  Temp: 98.4 F (36.9 C)  Weight: 97 lb 6.4 oz (44.2 kg)   Fetal Status: Fetal Heart Rate (bpm): 165   Movement: Absent     Physical Exam: BP 98/60   Pulse 70   Temp 98.4 F (36.9 C)   Wt 97 lb 6.4 oz (44.2 kg)   LMP  (LMP Unknown)   BMI 19.02 kg/m  CONSTITUTIONAL: Well-developed, well-nourished female in no acute distress.  NEUROLOGIC: Alert and oriented to person, place, and time. Normal reflexes, muscle tone coordination. No cranial nerve deficit noted. PSYCHIATRIC: Normal mood and affect. Normal behavior.  Normal judgment and thought content. SKIN: Skin is warm and dry. No rash noted. Not diaphoretic. No erythema. No pallor. HENT:  Normocephalic, atraumatic, External right and left ear normal. Oropharynx is clear and moist EYES: Conjunctivae and EOM are normal. Pupils are equal, round, and reactive to light. No scleral icterus.  NECK: Normal range of motion, supple, no masses CARDIOVASCULAR: Normal heart rate noted, regular rhythm RESPIRATORY: Effort and breath sounds normal, no problems with respiration noted BREASTS: symmetric, non-tender, no masses palpable ABDOMEN: Soft, nontender, nondistended, gravid. GU: normal appearing external female genitalia, nulliparous normal appearing cervix, scant white discharge in vagina, no lesions noted Bimanual: 10 weeks sized uterus, no adnexal tenderness or palpable lesions noted MUSCULOSKELETAL: Normal range of motion. EXT:  No edema and no tenderness. 2+ distal pulses.  Assessment and Plan:  Pregnancy: G2P1001 at 19w0dby 7 week [place] 1. Supervision of other normal pregnancy, antepartum - CHL AMB BABYSCRIPTS SCHEDULE OPTIMIZATION - AMBULATORY NON FORMULARY MEDICATION; 1 Device by Other route once a week. Blood Pressure Cuff Small Monitored Regularly at home ICD 10:Z34.90  Dispense: 1 kit; Refill: 0 - Obstetric Panel, Including HIV - [place]MFM OB COMP + 14 WK; Future - Culture, OB Urine - Cytology - PAP( [place]) - [place] [facility], multiple providers, fellows, medical students, virtual visits, MyChart.  - desires genetic testing, will have return 1-2 weeks for lab only visit  2. Seizures ([place]/o seizures as a child/teen, none in last 6 years, reports she [place] neurologist in [place] who never gave her specific diagnosis, patient thinks they were stress related as [facility]  Preterm labor symptoms and general obstetric precautions including but not limited to vaginal  bleeding, contractions, leaking of fluid and fetal movement were reviewed in detail with the patient.  Please refer to After Visit Summary for other counseling recommendations.   Return in about 4 weeks (around 02/16/2019) for OB visit, virtual.  KSloan Leiter6/16/2020 9:50 AM

## 2019-01-20 LAB — CYTOLOGY - PAP
Chlamydia: NEGATIVE
Diagnosis: NEGATIVE
Neisseria Gonorrhea: NEGATIVE

## 2019-01-21 LAB — OBSTETRIC PANEL, INCLUDING HIV
Antibody Screen: NEGATIVE
Basophils Absolute: 0 10*3/uL (ref 0.0–0.2)
Basos: 0 %
EOS (ABSOLUTE): 0 10*3/uL (ref 0.0–0.4)
Eos: 1 %
HIV Screen 4th Generation wRfx: NONREACTIVE
Hematocrit: 33.4 % — ABNORMAL LOW (ref 34.0–46.6)
Hemoglobin: 11.1 g/dL (ref 11.1–15.9)
Hepatitis B Surface Ag: NEGATIVE
Immature Grans (Abs): 0 10*3/uL (ref 0.0–0.1)
Immature Granulocytes: 0 %
Lymphocytes Absolute: 1.6 10*3/uL (ref 0.7–3.1)
Lymphs: 32 %
MCH: 27.6 pg (ref 26.6–33.0)
MCHC: 33.2 g/dL (ref 31.5–35.7)
MCV: 83 fL (ref 79–97)
Monocytes Absolute: 0.3 10*3/uL (ref 0.1–0.9)
Monocytes: 6 %
Neutrophils Absolute: 3.2 10*3/uL (ref 1.4–7.0)
Neutrophils: 61 %
Platelets: 236 10*3/uL (ref 150–450)
RBC: 4.02 x10E6/uL (ref 3.77–5.28)
RDW: 13.8 % (ref 11.7–15.4)
RPR Ser Ql: NONREACTIVE
Rh Factor: POSITIVE
Rubella Antibodies, IGG: 2.27 index (ref >0.99)
WBC: 5.2 10*3/uL (ref 3.4–10.8)

## 2019-01-21 LAB — URINE CULTURE, OB REFLEX

## 2019-01-21 LAB — CULTURE, OB URINE

## 2019-02-01 ENCOUNTER — Other Ambulatory Visit: Payer: Self-pay | Admitting: *Deleted

## 2019-02-01 ENCOUNTER — Telehealth: Payer: Self-pay | Admitting: Family Medicine

## 2019-02-01 DIAGNOSIS — Z3480 Encounter for supervision of other normal pregnancy, unspecified trimester: Secondary | ICD-10-CM

## 2019-02-01 NOTE — Telephone Encounter (Signed)
Spoke with patient about her appointment on 6/30 @ 9:30. Patient was instructed to wear a face mask. Patient instructed no visitors are allowed with her. Patient screened for covid symptoms and denied having any.

## 2019-02-02 ENCOUNTER — Other Ambulatory Visit: Payer: BC Managed Care – PPO

## 2019-02-02 ENCOUNTER — Other Ambulatory Visit: Payer: Self-pay

## 2019-02-02 DIAGNOSIS — Z3480 Encounter for supervision of other normal pregnancy, unspecified trimester: Secondary | ICD-10-CM

## 2019-02-09 ENCOUNTER — Encounter: Payer: Self-pay | Admitting: *Deleted

## 2019-02-16 ENCOUNTER — Other Ambulatory Visit: Payer: Self-pay

## 2019-02-16 ENCOUNTER — Telehealth (INDEPENDENT_AMBULATORY_CARE_PROVIDER_SITE_OTHER): Payer: BC Managed Care – PPO

## 2019-02-16 DIAGNOSIS — Z3480 Encounter for supervision of other normal pregnancy, unspecified trimester: Secondary | ICD-10-CM

## 2019-02-16 DIAGNOSIS — Z3A13 13 weeks gestation of pregnancy: Secondary | ICD-10-CM

## 2019-02-16 DIAGNOSIS — Z3482 Encounter for supervision of other normal pregnancy, second trimester: Secondary | ICD-10-CM

## 2019-02-16 NOTE — Progress Notes (Signed)
   TELEHEALTH VIRTUAL OBSTETRICS VISIT ENCOUNTER NOTE  I connected with Valerie Brooks on 02/16/19 at  9:15 AM EDT by MyChart Virtual Visit at home and verified that I am speaking with the correct person using two identifiers.   I discussed the limitations, risks, security and privacy concerns of performing an evaluation and management service by telephone and the availability of in person appointments. I also discussed with the patient that there may be a patient responsible charge related to this service. The patient expressed understanding and agreed to proceed.  Subjective:  Valerie Brooks is a 23 y.o. G2P1001 at [redacted]w[redacted]d being followed for ongoing prenatal care.  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  Patient reports nausea. Reports fetal movement. Denies any contractions, bleeding or leaking of fluid.   The following portions of the patient's history were reviewed and updated as appropriate: allergies, current medications, past family history, past medical history, past social history, past surgical history and problem list.   Objective:   General:  Alert, oriented and cooperative.   Mental Status: Normal mood and affect perceived. Normal judgment and thought content.  Rest of physical exam deferred due to type of encounter  Assessment and Plan:  Pregnancy: G2P1001 at [redacted]w[redacted]d 1. Supervision of other normal pregnancy, antepartum -Patient unable to check BP right now but will do it tonight and enter in [place]. Review of BPs in [facility] normal. -Patient reporting nausea during the day and unable to take phenergan because it makes her drowsy at work. Will send RX for zofran. Discussed risks of constipation with zofran use. -Reviewed labs from NOB  visit and low risk panorama. Anatomy u/s scheduled 8/25 -Anticipatory guidance of next visits reviewed at length with patient  Preterm labor symptoms and general obstetric precautions including but not limited to vaginal bleeding, contractions, leaking of fluid and fetal movement were reviewed in detail with the patient.  I discussed the assessment and treatment plan with the patient. The patient was provided an opportunity to ask questions and all were answered. The patient agreed with the plan and demonstrated an understanding of the instructions. The patient was advised to call back or seek an in-person office evaluation/go to [facility]U at [facility] for any urgent or concerning symptoms. Please refer to After Visit Summary for other counseling recommendations.   I provided 15 minutes of non-face-to-face time during this encounter.  Return in about 7 weeks (around 04/06/2019) for Return OB visit.  Future Appointments  Date Time Provider [place]  03/30/2019 10:45 AM WH-MFC [place] 2 WH-MFCUS MFC-US     M , [place] for [employer], [place]

## 2019-02-16 NOTE — Progress Notes (Signed)
I connected with  Valerie Brooks on 02/16/19 at  9:15 AM EDT by telephone and verified that I am speaking with the correct person using two identifiers.   I discussed the limitations, risks, security and privacy concerns of performing an evaluation and management service by telephone and the availability of in person appointments. I also discussed with the patient that there may be a patient responsible charge related to this service. The patient expressed understanding and agreed to proceed.   Verdell Carmine, RN 02/16/2019  9:24 AM

## 2019-02-17 MED ORDER — ONDANSETRON 8 MG PO TBDP: 8.0000 mg | ORAL_TABLET | Freq: Three times a day (TID) | ORAL | 2 refills | Status: DC | PRN

## 2019-02-18 ENCOUNTER — Telehealth: Payer: Self-pay

## 2019-02-18 NOTE — Telephone Encounter (Signed)
Rep from [employer] called stating that they are calling to f/u on the pt's [employer]. The case # is [id number]

## 2019-02-19 NOTE — Telephone Encounter (Signed)
Called [place] and informed them our correct address of [address] instead of [place].  The rep requested that we contact our direct rep and advise them send a letter to them with verification of the address.  I informed him that I would.

## 2019-02-22 ENCOUNTER — Telehealth: Payer: Self-pay | Admitting: Emergency Medicine

## 2019-02-22 DIAGNOSIS — Z3480 Encounter for supervision of other normal pregnancy, unspecified trimester: Secondary | ICD-10-CM

## 2019-02-22 NOTE — Telephone Encounter (Signed)
Attempted to contact pt to inform her of upcoming appointment on 8/26 @ 1000. LVM informing pt of appointment date and time. Encouraged pt to give the office a call with questions or concerns.

## 2019-03-01 ENCOUNTER — Other Ambulatory Visit: Payer: Self-pay

## 2019-03-01 ENCOUNTER — Encounter: Payer: Self-pay | Admitting: *Deleted

## 2019-03-01 ENCOUNTER — Ambulatory Visit (INDEPENDENT_AMBULATORY_CARE_PROVIDER_SITE_OTHER): Payer: BC Managed Care – PPO | Admitting: Advanced Practice Midwife

## 2019-03-01 ENCOUNTER — Encounter: Payer: Self-pay | Admitting: Advanced Practice Midwife

## 2019-03-01 DIAGNOSIS — Z3482 Encounter for supervision of other normal pregnancy, second trimester: Secondary | ICD-10-CM

## 2019-03-01 DIAGNOSIS — Z3480 Encounter for supervision of other normal pregnancy, unspecified trimester: Secondary | ICD-10-CM

## 2019-03-01 DIAGNOSIS — Z3A14 14 weeks gestation of pregnancy: Secondary | ICD-10-CM

## 2019-03-01 NOTE — Progress Notes (Signed)
I connected with  Valerie Brooks on 03/01/19 at 10:15 AM EDT by telephone and verified that I am speaking with the correct person using two identifiers.   I discussed the limitations, risks, security and privacy concerns of performing an evaluation and management service by telephone and virtually and the availability of in person appointments. I also discussed with the patient that there may be a patient responsible charge related to this service. The patient expressed understanding and agreed to proceed.   Has been active in [facility] but not taken bp yet today and does not have cuff with her- will take bp later today and will enter into [facility]ys[facility]s. Marrio Scribner,RN 03/01/2019  10:01 AM

## 2019-03-01 NOTE — Progress Notes (Signed)
   TELEHEALTH VIRTUAL OBSTETRICS VISIT ENCOUNTER NOTE  I connected with Valerie Brooks on 03/01/19 at 10:15 AM EDT by telephone at home and verified that I am speaking with the correct person using two identifiers.   I discussed the limitations, risks, security and privacy concerns of performing an evaluation and management service by telephone and the availability of in person appointments. I also discussed with the patient that there may be a patient responsible charge related to this service. The patient expressed understanding and agreed to proceed.  Subjective:  Valerie Brooks is a 23 y.o. G2P1001 at [redacted]w[redacted]d being followed for ongoing prenatal care.  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  Patient reports no complaints. Reports fetal movement. Denies any contractions, bleeding or leaking of fluid.   The following portions of the patient's history were reviewed and updated as appropriate: allergies, current medications, past family history, past medical history, past social history, past surgical history and problem list.   Objective:   General:  Alert, oriented and cooperative.   Mental Status: Normal mood and affect perceived. Normal judgment and thought content.  Rest of physical exam deferred due to type of encounter  Assessment and Plan:  Pregnancy: G2P1001 at [redacted]w[redacted]d 1. Supervision of other normal pregnancy, antepartum - Routine care  Preterm labor symptoms and general obstetric precautions including but not limited to vaginal bleeding, contractions, leaking of fluid and fetal movement were reviewed in detail with the patient.  I discussed the assessment and treatment plan with the patient. The patient was provided an opportunity to ask  questions and all were answered. The patient agreed with the plan and demonstrated an understanding of the instructions. The patient was advised to call back or seek an in-person office evaluation/go to MAU at [facility] for any urgent or concerning symptoms. Please refer to After Visit Summary for other counseling recommendations.   I provided 15 minutes of non-face-to-face time during this encounter.  Return in about 6 weeks (around 04/12/2019) for virtual visit .  Future Appointments  Date Time Provider [place]  03/30/2019 10:45 AM WH-MFC [place] 2 WH-MFCUS MFC-US  03/31/2019 10:00 AM [place] GENETIC COUNSELING [facility] [place] MFC-US    Marcille Buffy DNP, CNM  03/01/19  10:32 AM  [facility]

## 2019-03-30 ENCOUNTER — Other Ambulatory Visit: Payer: Self-pay

## 2019-03-30 ENCOUNTER — Other Ambulatory Visit (HOSPITAL_COMMUNITY): Payer: Self-pay | Admitting: *Deleted

## 2019-03-30 ENCOUNTER — Ambulatory Visit (HOSPITAL_COMMUNITY)
Admission: RE | Admit: 2019-03-30 | Discharge: 2019-03-30 | Disposition: A | Discharge status: Home / Self Care | Payer: Medicaid Other | Source: Ambulatory Visit | Attending: Obstetrics and Gynecology | Admitting: Obstetrics and Gynecology

## 2019-03-30 DIAGNOSIS — Z363 Encounter for antenatal screening for malformations: Secondary | ICD-10-CM | POA: Diagnosis not present

## 2019-03-30 DIAGNOSIS — Z3A19 19 weeks gestation of pregnancy: Secondary | ICD-10-CM

## 2019-03-30 DIAGNOSIS — Z3686 Encounter for antenatal screening for cervical length: Secondary | ICD-10-CM

## 2019-03-30 DIAGNOSIS — D573 Sickle-cell trait: Secondary | ICD-10-CM | POA: Insufficient documentation

## 2019-03-30 DIAGNOSIS — Z3480 Encounter for supervision of other normal pregnancy, unspecified trimester: Secondary | ICD-10-CM | POA: Insufficient documentation

## 2019-03-30 DIAGNOSIS — Z862 Personal history of diseases of the blood and blood-forming organs and certain disorders involving the immune mechanism: Secondary | ICD-10-CM

## 2019-03-31 ENCOUNTER — Ambulatory Visit (HOSPITAL_BASED_OUTPATIENT_CLINIC_OR_DEPARTMENT_OTHER): Payer: Medicaid Other | Admitting: Genetic Counselor

## 2019-03-31 ENCOUNTER — Other Ambulatory Visit: Payer: Self-pay

## 2019-03-31 ENCOUNTER — Ambulatory Visit (HOSPITAL_COMMUNITY): Payer: Self-pay | Admitting: Genetic Counselor

## 2019-03-31 DIAGNOSIS — D573 Sickle-cell trait: Secondary | ICD-10-CM

## 2019-03-31 DIAGNOSIS — Z315 Encounter for genetic counseling: Secondary | ICD-10-CM | POA: Diagnosis not present

## 2019-03-31 DIAGNOSIS — Z3A19 19 weeks gestation of pregnancy: Secondary | ICD-10-CM

## 2019-03-31 NOTE — Progress Notes (Signed)
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 Valerie Brooks came to her appointment alone due to COVID-19 visitor restrictions.   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 year old female. Her current pregnancy has completed [redacted]w[redacted]d(Estimated Date of Delivery: 08/24/19).  Ms. DIntrieridenied exposure to environmental toxins or chemical agents. She denied the use of alcohol, tobacco or street drugs. She denied significant viral illnesses, fevers, and bleeding during the course of her pregnancy. Her medical and surgical histories were noncontributory.  Family History  A three generation pedigree was drafted and reviewed. The family history is remarkable for the following:  - Ms. DHondurasand her paternal brother both had seizures in childhood. Hers began around the age of 89and stopped by the age of 89 She is not sure of the age of onset of her brother's seizures or the age at which they stopped. She is not currently taking any antiepileptics. We discussed that many forms of seizures are multifactorial in nature, occurring due to a combination of genetic, lifestyle, and environmental factors. Given that the remainder of Ms. DHondurasand her brother's medical histories are noncontributory and they have no developmental or behavioral concerns, it is most likely that their seizures are nonsyndromic. However, even isolated seizures can appear to run in families. Ms. DCragunwas counseled that if her seizures are isolated, there is approximately a 4% chance that her children could also have seizures.   The remaining family histories were reviewed and found to be noncontributory for birth defects, intellectual disability, recurrent pregnancy loss, and known  genetic conditions. Ms. DFurnohad limited information about her partner's family history; thus, risk assessment was limited.   The patient's ethnicity is African American. The father of the pregnancy's ethnicity is African American. Ashkenazi Jewish ancestry and consanguinity were denied. Pedigree will be scanned under Media.  Discussion  Ms. 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  We discussed that SCA is one condition in a group of blood disorders that affect hemoglobin in red blood cells (hemoglobinopathies). Hemoglobin is a protein that transports oxygen from the lungs to organs and tissues throughout the body. Individuals with SCA have an inherited structural abnormality in hemoglobin's beta globin chains due to a single amino acid change in the HBB gene. Instead of producing normal adult hemoglobin (Hgb A), individuals with SCA produce an atypical form of hemoglobin called hemoglobin S (Hgb S). Typically, individuals are expected to have two copies of Hgb A (Hgb AA). Individuals who are carriers of SCA have one copy of Hgb A and one copy of Hgb S (Hgb AS), whereas individuals affected by SCA have two copies of Hgb S (Hgb SS). Carriers of SCA are often said to have sickle cell "trait".  Hgb S alters the configuration of the hemoglobin molecule. As a result, individuals with SCA have red blood cells that can sickle and obstruct blood flow in small blood vessels, causing ischemia of tissues and organs and episodes of vaso-occlusive crisis. The amino acid change in the HBB gene also causes red blood cells to become fragile and break down easily, which results in chronic anemia. Additional complications associated with SCA may  include organ damage, frequent infections, acute chest syndrome, ischemic stroke, splenic sequestration, priapism, and pulmonary hypertension. SCA  is inherited in an autosomal recessive pattern, where both parents must carry Hgb S trait to be at risk of having an affected child. If Valerie Brooks's partner were also a carrier of SCA, they would have a 1 in 4 (25%) chance of having a child with SCA.   Hgb S is just one variant form of hemoglobin caused by a mutation in the HBB gene. It is also possible that Valerie Brooks's partner could carry another variant form of hemoglobin, such as hemoglobin C. If he did, the couple would have a 1 in 4 (25%) chance of having a child with hemoglobin [place] disease (HbSC disease). Individuals with HbSC disease have red blood cells that contain both hemoglobin S and hemoglobin C. These variant forms of hemoglobin can cause red blood cells to become rigid and sickle, blocking small blood vessels and making it difficult for the red blood cells to deliver oxygen to the body's tissues. This can cause severe pain and organ damage, just as we see in individuals with sickle cell disease. Individuals with HbSC disease are at risk of the same complications as those associated with sickle cell disease, such as pain crises, acute chest syndrome, infections, asplenia, and strokes; however, these complications may occur at a lesser frequency.  Finally, Valerie Brooks's partner may have a different variant in the HBB gene that could make him a carrier of beta-thalassemia. If he did, the couple would have a 1 in 4 (25%) chance of having a child with sickle beta thalassemia. The severity of sickle beta thalassemia depends on the normal amount of beta globin that is produced. If an individual produces no beta globin (sickle beta zero thalassemia), they will experience symptoms similar to SCA. If an individual produces a reduced amount of beta globin (sickle beta plus thalassemia), they may experience symptoms that are similar to a milder form of SCA.  Given his ethnicity, [name]'s partner has a 1 in 11 chance of carrying hemoglobin S trait, a 1 in  38 chance at carrying hemoglobin C trait, and a 1 in 75 chance of carrying beta-thalassemia. It is recommended that he undergo carrier screening for HBB-related hemoglobinopathies to refine the risks for the current pregnancy to be affected with sickle cell disease, HbSC disease, or sickle beta thalassemia. Ms. Outen indicated that she is interested in pursuing carrier screening for her partner.  Ms. Kealey's carrier screening was negative for the other 13 conditions screened. Thus, her risk to be a carrier for these additional conditions (listed separately in the laboratory report) has been reduced but not eliminated.   We also reviewed that Ms. Hamler had [facility] NIPS through [facility] that was low-risk for fetal aneuploidies. We reviewed that these results showed a less than 1 in 10,000 risk for trisomies 21, 18 and 13, and monosomy X (Turner syndrome). In addition, the risk for triploidy and sex chromosome trisomies (47,XXX and 47,XXY) was also low. Ms. Meece elected to have cffDNA analysis for 22q11 deletion syndrome, which was also low risk (1 in 9000). We reviewed that while this testing identifies > 99% of pregnancies with trisomy 72, trisomy 48, sex chromosome trisomies (47,XXX and 47,XXY), and triploidy, it is NOT diagnostic. A positive test result requires confirmation by CVS or amniocentesis, and a negative test result does not rule out a fetal chromosome abnormality. She also understands that this testing does not identify all genetic  conditions.  A complete ultrasound was performed yesterday. The ultrasound report will be sent under separate cover. There were no visualized fetal anomalies or markers suggestive of aneuploidy.  Ms. Diver was also counseled regarding diagnostic testing via amniocentesis. We discussed the technical aspects of the procedure and quoted up to a 1 in 500 (0.2%) risk for spontaneous pregnancy loss or other adverse pregnancy outcomes as a result of amniocentesis.  Cultured cells from an amniocentesis sample allow for the visualization of a fetal karyotype, which can detect >99% of chromosomal aberrations. Chromosomal microarray can also be performed to identify smaller deletions or duplications of fetal chromosomal material. Amniocentesis could also be performed to assess whether the baby is affected by sickle cell disease, HbSC disease, or sickle beta thalassemia. After careful consideration, Ms. Kandler declined amniocentesis at this time. She understands that amniocentesis is available at any point after 16 weeks of pregnancy and that she may opt to undergo the procedure at a later date should she change her mind.  Lastly, the patient was made aware that screening for open neural tube defects (ONTDs) via MS-AFP in the second trimester in addition to level II ultrasound examination is recommended. We reviewed that Ms. Hasz's level II ultrasound did not detect any ONTDs, and that level II ultrasound is able to detect them with 90-95% sensitivity. However, normal results from any of the above options do not guarantee a normal baby, as 3-5% of newborns have some type of birth defect, many of which are not prenatally diagnosable.  Ms. Jutte was interested in pursuing HBB-related hemoglobinopathy carrier screening for her partner, Dorcas Mcmurray. Given thatMr. Steeleis currently uninsured, we ordered carrier screening through the laboratory [facility]. We applied for the Patient Assistance Program for Mr. Rowe Robert, as he qualifies for free testing.[facility] will mail a saliva kit to the patient's home. The saliva kit will come with return packaging so that the couple can mail the sample back to the laboratory at no cost. Results from carrier screeningwill be returned 10-21 days after the couple ships the sample back to the laboratory. I will call ValerieHonduras with the results when they become available.  I counseled Ms. [place] regarding the above risks and available  options. The approximate face-to-face time with the genetic counselor was 30 minutes.  In summary:  Discussed carrier screening results and options for follow-up testing ? Silent carrier for alpha-thalassemia ? Desires partner carrier screening for alpha-thalassemia. We will follow results  Reviewed negative NIPS results ? Reduction in risk for Down syndrome, trisomy 51, trisomy 27, sex chromosome aneuploidies, and 22q11.2 deletion syndrome  Reviewed results of ultrasound ? No fetal anomalies or markers seen ? Reduction in risk for fetal aneuploidy  Offered additional testing and screening ? Declined amniocentesis ? MS-AFP screening recommended  Reviewed family history concerns   Buelah Manis, MS Genetic Counselor

## 2019-04-01 ENCOUNTER — Encounter: Payer: Self-pay | Admitting: Obstetrics and Gynecology

## 2019-04-05 ENCOUNTER — Ambulatory Visit (HOSPITAL_COMMUNITY): Payer: Medicaid Other | Admitting: *Deleted

## 2019-04-05 ENCOUNTER — Encounter (HOSPITAL_COMMUNITY): Payer: Self-pay | Admitting: *Deleted

## 2019-04-05 ENCOUNTER — Ambulatory Visit (HOSPITAL_COMMUNITY)
Admission: RE | Admit: 2019-04-05 | Discharge: 2019-04-05 | Disposition: A | Discharge status: Home / Self Care | Payer: Medicaid Other | Source: Ambulatory Visit | Attending: Obstetrics and Gynecology | Admitting: Obstetrics and Gynecology

## 2019-04-05 ENCOUNTER — Other Ambulatory Visit: Payer: Self-pay

## 2019-04-05 DIAGNOSIS — Z3490 Encounter for supervision of normal pregnancy, unspecified, unspecified trimester: Secondary | ICD-10-CM | POA: Diagnosis present

## 2019-04-05 DIAGNOSIS — N760 Acute vaginitis: Secondary | ICD-10-CM | POA: Insufficient documentation

## 2019-04-05 DIAGNOSIS — Z3480 Encounter for supervision of other normal pregnancy, unspecified trimester: Secondary | ICD-10-CM | POA: Diagnosis present

## 2019-04-05 DIAGNOSIS — Z3A19 19 weeks gestation of pregnancy: Secondary | ICD-10-CM

## 2019-04-05 DIAGNOSIS — B9689 Other specified bacterial agents as the cause of diseases classified elsewhere: Secondary | ICD-10-CM | POA: Diagnosis present

## 2019-04-05 DIAGNOSIS — O418X10 Other specified disorders of amniotic fluid and membranes, first trimester, not applicable or unspecified: Secondary | ICD-10-CM | POA: Diagnosis present

## 2019-04-05 DIAGNOSIS — Z3686 Encounter for antenatal screening for cervical length: Secondary | ICD-10-CM | POA: Insufficient documentation

## 2019-04-05 DIAGNOSIS — O468X1 Other antepartum hemorrhage, first trimester: Secondary | ICD-10-CM | POA: Insufficient documentation

## 2019-04-07 ENCOUNTER — Other Ambulatory Visit (HOSPITAL_COMMUNITY): Payer: Medicaid Other

## 2019-04-09 ENCOUNTER — Telehealth: Payer: Self-pay | Admitting: Obstetrics & Gynecology

## 2019-04-09 NOTE — Telephone Encounter (Signed)
Attempted to contact patient about her appointment on 9/8 @ 10:15. Patient instructed that the appointment is a mychart visit. Patient instructed to download the mychart app if not already done so. Patient instructed to give the office a call with any concerns.

## 2019-04-13 ENCOUNTER — Other Ambulatory Visit: Payer: Self-pay

## 2019-04-13 ENCOUNTER — Telehealth (INDEPENDENT_AMBULATORY_CARE_PROVIDER_SITE_OTHER): Payer: Medicaid Other | Admitting: Obstetrics & Gynecology

## 2019-04-13 VITALS — BP 114/75 | HR 80

## 2019-04-13 DIAGNOSIS — Z3A21 21 weeks gestation of pregnancy: Secondary | ICD-10-CM

## 2019-04-13 DIAGNOSIS — R569 Unspecified convulsions: Secondary | ICD-10-CM

## 2019-04-13 DIAGNOSIS — O468X1 Other antepartum hemorrhage, first trimester: Secondary | ICD-10-CM

## 2019-04-13 DIAGNOSIS — D573 Sickle-cell trait: Secondary | ICD-10-CM

## 2019-04-13 DIAGNOSIS — O26879 Cervical shortening, unspecified trimester: Secondary | ICD-10-CM

## 2019-04-13 DIAGNOSIS — O418X10 Other specified disorders of amniotic fluid and membranes, first trimester, not applicable or unspecified: Secondary | ICD-10-CM

## 2019-04-13 DIAGNOSIS — Z3490 Encounter for supervision of normal pregnancy, unspecified, unspecified trimester: Secondary | ICD-10-CM

## 2019-04-13 DIAGNOSIS — Z3480 Encounter for supervision of other normal pregnancy, unspecified trimester: Secondary | ICD-10-CM

## 2019-04-13 DIAGNOSIS — O468X2 Other antepartum hemorrhage, second trimester: Secondary | ICD-10-CM

## 2019-04-13 DIAGNOSIS — O26872 Cervical shortening, second trimester: Secondary | ICD-10-CM

## 2019-04-13 DIAGNOSIS — B9689 Other specified bacterial agents as the cause of diseases classified elsewhere: Secondary | ICD-10-CM

## 2019-04-13 DIAGNOSIS — N760 Acute vaginitis: Secondary | ICD-10-CM

## 2019-04-13 NOTE — Progress Notes (Signed)
I connected with  Valerie Brooks at 10:15 AM EDT by telephone and verified that I am speaking with the correct person using two identifiers.   I discussed the limitations, risks, security and privacy concerns of performing an evaluation and management service by telephone and the availability of in person appointments. I also discussed with the patient that there may be a patient responsible charge related to this service. The patient expressed understanding and agreed to proceed.   Verdell Carmine, RN 04/13/2019  9:37 AM

## 2019-04-13 NOTE — Progress Notes (Signed)
TELEHEALTH OBSTETRICS PRENATAL VIRTUAL VIDEO VISIT ENCOUNTER NOTE  Provider location: [facility] [employer] at [facility]   I connected with Valerie Brooks on 04/13/19 at 10:15 AM EDT by MyChart Video Encounter at home and verified that I am speaking with the correct person using two identifiers.   I discussed the limitations, risks, security and privacy concerns of performing an evaluation and management service virtually and the availability of in person appointments. I also discussed with the patient that there may be a patient responsible charge related to this service. The patient expressed understanding and agreed to proceed. Subjective:  Valerie Brooks is a 23 y.o. G2P1001 at 24w0dbeing seen today for ongoing prenatal care.  She is currently monitored for the following issues for this high-risk pregnancy and has Short cervix affecting pregnancy; Hemoglobin S (Hb-S) trait ([place]; Bacterial vaginosis; Intrauterine pregnancy; Subchorionic hematoma; Supervision of other normal pregnancy, antepartum; and Seizures ([place] on their problem [place].  Patient reports no complaints.  Contractions: Not present. Vag. Bleeding: None.  Movement: Present. Denies any leaking of fluid.   The following portions of the patient's history were reviewed and updated as appropriate: allergies, current medications, past family history, past medical history, past social history, past surgical history and problem list.   Objective:   Vitals:   04/13/19 0937  BP: 114/75  Pulse: 80    Fetal Status:     Movement: Present     General:  Alert, oriented and cooperative. Patient is in no acute distress.  Respiratory: Normal respiratory effort, no problems with respiration noted  Mental Status: Normal mood and affect. Normal behavior. Normal judgment and thought content.  Rest of physical exam deferred due to type of encounter  Imaging: [place]fm Ob Transvaginal  Result Date:  04/05/2019 ----------------------------------------------------------------------  OBSTETRICS REPORT                       (Signed Final 04/05/2019 04:09 pm) ---------------------------------------------------------------------- Patient Info  ID #:       [id number]                         D.O.B.:  005/27/1997(23 yrs)  Name:       Valerie Brooks            Visit Date: 04/05/2019 02:53 pm ---------------------------------------------------------------------- Performed By  Performed By:     JGeorgie Chard       Ref. Address:     [address] Attending:        RTama HighMD        [facility]  Referred By:      Sloan Leiter                    MD ---------------------------------------------------------------------- Orders   #  Description                          Code         Ordered By   1  [place] MFM OB TRANSVAGINAL               16109.6      Tama High  ----------------------------------------------------------------------   #  Order #                    Accession #                 Episode #   1  [id number]                  [id number]                  [id number]  ---------------------------------------------------------------------- Indications   [redacted] weeks gestation of pregnancy                Z3A.19  ---------------------------------------------------------------------- Fetal Evaluation  Num Of Fetuses:         1  Fetal Heart Rate(bpm):  129  Cardiac Activity:       Observed  Presentation:           Breech  Placenta:               Anterior  P. Cord Insertion:      Visualized  Amniotic Fluid  AFI FV:      Within normal limits                              Largest Pocket(cm)                              3.65 ---------------------------------------------------------------------- OB History  Gravidity:    2         Term:   1   Living:       1 ---------------------------------------------------------------------- Gestational Age  LMP:           19w 6d        Date:  11/17/18                 EDD:   08/24/19  Best:          19w 6d     Det. By:  LMP  (11/17/18)          EDD:   08/24/19 ---------------------------------------------------------------------- Anatomy  Thoracic:              Appears normal         Bladder:                Appears normal  Stomach:               Appears normal, left                         sided ---------------------------------------------------------------------- Cervix Uterus Adnexa  Cervix  Length:            2.7  cm.  Normal appearance by transabdominal scan. ----------------------------------------------------------------------  Impression  Patient returned for cervical length measurement. On  transabdominal scan performed last week, the cervix  measured 2.4 cm. The image was reviewed after the patient  left and we made an appointment for her to come today for  transvaginal ultrasound assessment. She had 1 previous  term vaginal delivery.  On ultrasound, amniotic fluid is normal and good fetal activity  is seen. On transvaginal ultrasound, the shortest cervical  length measurement is 2.7 cm, which is normal. No further  shortening was seen on transfundal pressure.  We reassured the patient of the findings. ---------------------------------------------------------------------- Recommendations  Follow-up scans as clinically indicated. ----------------------------------------------------------------------                  Tama High, MD Electronically Signed Final Report   04/05/2019 04:09 pm ----------------------------------------------------------------------  [place] Mfm Ob Comp + 14 Wk  Result Date: 03/30/2019 ----------------------------------------------------------------------  OBSTETRICS REPORT                       (Signed Final 03/30/2019 12:50 pm)  ---------------------------------------------------------------------- Patient Info  ID #:       [id number]                          D.O.B.:  [birth year] (23 yrs)  Name:       Valerie Brooks             Visit Date: 03/30/2019 10:55 am ---------------------------------------------------------------------- Performed By  Performed By:     Wilnette Kales        Ref. Address:      [place]  Attending:        Tama High MD        [facility]  Referred By:      Sloan Leiter                    MD ---------------------------------------------------------------------- Orders   #  Description                           Code        Ordered By   1  [place] MFM OB COMP + 14 WK                76805.01    KELLY DAVIS  ----------------------------------------------------------------------   #  Order #                     Accession #  Episode #   1  [id number]                   [id number]                 [id number]  ---------------------------------------------------------------------- Indications   History of sickle cell trait                    Z86.2   Encounter for antenatal screening for           Z36.3   malformations   Low risk NIPS   [redacted] weeks gestation of pregnancy                 Z3A.19  ---------------------------------------------------------------------- Fetal Evaluation  Num Of Fetuses:          1  Fetal Heart              130  Rate(bpm):  Cardiac Activity:        Observed  Presentation:            Cephalic  Placenta:                Anterior  P. Cord Insertion:       Visualized, central  Amniotic Fluid  AFI FV:      Within normal limits                              Largest Pocket(cm)                              3.76  Comment:    No placental abruption or previa identified.  ---------------------------------------------------------------------- Biometry  BPD:      43.9  mm     G. Age:  19w 2d         63  %    CI:         74.77  %    70 - 86                                                          FL/HC:       16.5  %    16.1 - 18.3  HC:      161.1  mm     G. Age:  18w 6d         38  %    HC/AC:       1.22       1.09 - 1.39  AC:      131.8  mm     G. Age:  18w 5d         35  %    FL/BPD:      60.6  %  FL:       26.6  mm     G. Age:  18w 1d         15  %    FL/AC:       20.2  %    20 - 24  CER:      18.9  mm     G. Age:  18w 3d         34  %  NFT:       3.8  mm  LV:        5.4  mm  CM:        3.2  mm  Est. FW:     242   g      0 lb 9 oz     19  %                     m ---------------------------------------------------------------------- OB History  Gravidity:    2         Term:   1  Living:       1 ---------------------------------------------------------------------- Gestational Age  LMP:           19w 0d        Date:  11/17/18                 EDD:    08/24/19  U/S Today:     18w 5d                                        EDD:    08/26/19  Best:          19w 0d     Det. By:  LMP  (11/17/18)          EDD:    08/24/19 ---------------------------------------------------------------------- Anatomy  Cranium:               Appears normal         Aortic Arch:            Appears normal  Cavum:                 Appears normal         Ductal Arch:            Appears normal  Ventricles:            Appears normal         Diaphragm:              Appears normal  Choroid Plexus:        Appears normal         Stomach:                Appears normal,                                                                        left sided  Cerebellum:            Appears normal         Abdomen:                Appears normal  Posterior Fossa:       Appears normal         Abdominal Wall:         Appears nml (cord  insert, abd wall)  Nuchal Fold:            Appears normal         Cord Vessels:           Appears normal (3                                                                        vessel cord)  Face:                  Appears normal         Kidneys:                Appear normal                         (orbits and profile)  Lips:                  Appears normal         Bladder:                Appears normal  Thoracic:              Appears normal         Spine:                  Appears normal  Heart:                 Appears normal         Upper Extremities:      Appears normal                         (4CH, axis, and                         situs)  RVOT:                  Appears normal         Lower Extremities:      Appears normal  LVOT:                  Appears normal  Other:  Fetus appears to be female. Heels and 5th digit visualized. Open          hands visualized. Nasal bone visualized. ---------------------------------------------------------------------- Cervix Uterus Adnexa  Cervix  Length:            2.6  cm.  Normal appearance by transabdominal scan.  Uterus  No abnormality visualized.  Left Ovary  Size(cm)     3.74   x   2.58   x  2.47      Vol(ml):  12.48  Within normal limits.  Right Ovary  No adnexal mass visualized.  Cul De Sac  No free fluid seen.  Adnexa  No abnormality visualized. ---------------------------------------------------------------------- Impression  We performed fetal anatomy scan. No makers of  aneuploidies or fetal structural defects are seen. Fetal  biometry is consistent with her previously-established dates.  Amniotic fluid is normal and good fetal activity is seen.  Patient understands the limitations of ultrasound in detecting  fetal anomalies.  On  transabdominal scan, the shortest cervical length  measurement is 2.4 cm. Patient had a term vaginal delivery.  I called the patient and reassured her of normal fetal  anatomical survey. I also explained cervical length  measurement and that we recommend transvaginal   assessment next week.  On cell-free fetal DNA screening, the risks of fetal  aneuploidies are not increased. Patient has sickle-cell trait. ---------------------------------------------------------------------- Recommendations  -Transvaginal assessment of cervical length next week. ----------------------------------------------------------------------                  Tama High, MD Electronically Signed Final Report   03/30/2019 12:50 pm ----------------------------------------------------------------------   Assessment and Plan:  Pregnancy: G2P1001 at 70w0d1. Supervision of other normal pregnancy, antepartum +FM  2. Bacterial vaginosis  3. Intrauterine pregnancy  4. Subchorionic hematoma in first trimester, single or unspecified fetus No bleeding   5. Short cervix affecting pregnancy 04/04/2019 Cervix Length:   2.7  cm.  6. Seizures (HCC) Last seizure was 7 years prev.    7. Hemoglobin S (Hb-S) trait ([place] not tested.  She has the kit and he will get tested.    Preterm labor symptoms and general obstetric precautions including but not limited to vaginal bleeding, contractions, leaking of fluid and fetal movement were reviewed in detail with the patient. I discussed the assessment and treatment plan with the patient. The patient was provided an opportunity to ask questions and all were answered. The patient agreed with the plan and demonstrated an understanding of the instructions. The patient was advised to call back or seek an in-person office evaluation/[facility]. Please refer to After Visit Summary for other counseling recommendations.   I provided 12 minutes of face-to-face time during this encounter.  Return in about 4 weeks (around 05/11/2019) for Web based.  Future Appointments  Date Time Provider [place] 04/13/2019 10:15 AM [facility] Drafts [facility]    CLavonia Drafts [facility]

## 2019-05-11 ENCOUNTER — Telehealth (INDEPENDENT_AMBULATORY_CARE_PROVIDER_SITE_OTHER): Payer: Medicaid Other | Admitting: Student

## 2019-05-11 ENCOUNTER — Telehealth: Payer: Medicaid Other | Admitting: Student

## 2019-05-11 ENCOUNTER — Other Ambulatory Visit: Payer: Self-pay

## 2019-05-11 DIAGNOSIS — N760 Acute vaginitis: Secondary | ICD-10-CM

## 2019-05-11 DIAGNOSIS — Z3480 Encounter for supervision of other normal pregnancy, unspecified trimester: Secondary | ICD-10-CM

## 2019-05-11 DIAGNOSIS — B9689 Other specified bacterial agents as the cause of diseases classified elsewhere: Secondary | ICD-10-CM

## 2019-05-11 DIAGNOSIS — Z3A25 25 weeks gestation of pregnancy: Secondary | ICD-10-CM

## 2019-05-11 DIAGNOSIS — Z3490 Encounter for supervision of normal pregnancy, unspecified, unspecified trimester: Secondary | ICD-10-CM

## 2019-05-11 DIAGNOSIS — O26892 Other specified pregnancy related conditions, second trimester: Secondary | ICD-10-CM

## 2019-05-11 NOTE — Progress Notes (Signed)
Patient ID: Valerie Brooks, female   DOB: [birth year], 23 y.o.   MRN: [id number] I connected with@ on 05/11/19 at  3:35 PM EDT by: mychart  and verified that I am speaking with the correct person using two identifiers.  Patient is located at my chart and provider is located at home.     The purpose of this virtual visit is to provide medical care while limiting exposure to the novel coronavirus. I discussed the limitations, risks, security and privacy concerns of performing an evaluation and management service by my chart and the availability of in person appointments. I also discussed with the patient that there may be a patient responsible charge related to this service. By engaging in this virtual visit, you consent to the provision of healthcare.  Additionally, you authorize for your insurance to be billed for the services provided during this visit.  The patient expressed understanding and agreed to proceed.  The following staff members participated in the virtual visit:  Lowanda Foster     PRENATAL VISIT NOTE  Subjective:  Valerie Brooks is a 23 y.o. G2P1001 at [redacted]w[redacted]d  for phone visit for ongoing prenatal care.  She is currently monitored for the following issues for this low-risk pregnancy and has Short cervix affecting pregnancy; Hemoglobin S (Hb-S) trait ([place]); Bacterial vaginosis; Intrauterine pregnancy; Subchorionic hematoma; Supervision of other normal pregnancy, antepartum; and Seizures ([place]) on their problem list.  Patient reports no complaints  other than back pain. .  Contractions: Not present. Vag. Bleeding: None.  Movement: Present. Denies leaking of fluid.   The following portions of the patient's history were reviewed and updated as appropriate: allergies, current medications, past family history, past medical history, past social history, past surgical history and problem list.   Objective:   Vitals:   05/11/19 1534  BP: 109/71  Pulse: 80   Self-Obtained  Fetal  Status:     Movement: Present     Assessment and Plan:  Pregnancy: G2P1001 at [redacted]w[redacted]d 1. Supervision of other normal pregnancy, antepartum Reviewed patient's blood pressure in [place]; patient has not been taking it weekly but she agrees to try.  Instructions given on 2hour GTT Wants to do Nexplanon inpatient 2. Bacterial vaginosis No symptoms  3. Intrauterine pregnancy   4. Subchorionic hematoma in first trimester, single or unspecified fetus Resolved; no symptoms.   Preterm labor symptoms and general obstetric precautions including but not limited to vaginal bleeding, contractions, leaking of fluid and fetal movement were reviewed in detail with the patient.  Return in about 3 weeks (around 06/01/2019), or 2 hour gtt and LROB in person.  Future Appointments  Date Time Provider [place]  06/01/2019  8:20 AM [facility]  06/01/2019  8:55 AM Nugent, Gerrie Nordmann, NP [facility]  06/29/2019  1:15 PM Danielle Rankin [facility] WOC     Time spent on virtual visit: 59minutes  [place], CNM

## 2019-05-11 NOTE — Progress Notes (Signed)
Pt states does not have BP Cuff with her right now, but took BP this am & recorded in BRx.

## 2019-05-11 NOTE — Progress Notes (Signed)
I connected with  Valerie Brooks on 05/11/19 at  9:35 AM EDT by telephone and verified that I am speaking with the correct person using two identifiers.   I discussed the limitations, risks, security and privacy concerns of performing an evaluation and management service by telephone and the availability of in person appointments. I also discussed with the patient that there may be a patient responsible charge related to this service. The patient expressed understanding and agreed to proceed.  Bethanne Ginger, [place] 05/11/2019  9:27 AM

## 2019-05-11 NOTE — Progress Notes (Signed)
I connected with  Valerie Brooks on 05/11/19 at  3:35 PM EDT by telephone and verified that I am speaking with the correct person using two identifiers.   I discussed the limitations, risks, security and privacy concerns of performing an evaluation and management service by telephone and the availability of in person appointments. I also discussed with the patient that there may be a patient responsible charge related to this service. The patient expressed understanding and agreed to proceed.  Valerie Brooks, No Name 05/11/2019  3:33 PM

## 2019-05-26 ENCOUNTER — Other Ambulatory Visit: Payer: Self-pay | Admitting: Lactation Services

## 2019-05-26 DIAGNOSIS — Z3480 Encounter for supervision of other normal pregnancy, unspecified trimester: Secondary | ICD-10-CM

## 2019-05-31 NOTE — Progress Notes (Signed)
Subjective:  Valerie Brooks is a 23 y.o. G2P1001 at [redacted]w[redacted]d being seen today for ongoing prenatal care.  She is currently monitored for the following issues for this high-risk pregnancy and has Short cervix affecting pregnancy; Hemoglobin S (Hb-S) trait ([place]); Bacterial vaginosis; Intrauterine pregnancy; Subchorionic hematoma; Supervision of other normal pregnancy, antepartum; and Seizures ([place]) on their problem list.  Patient reports no complaints.  Contractions: Not present. Vag. Bleeding: None.  Movement: Present. Denies leaking of fluid.   The following portions of the patient's history were reviewed and updated as appropriate: allergies, current medications, past family history, past medical history, past social history, past surgical history and problem list. Problem list updated.  Objective:   Vitals:   06/01/19 0848  BP: 121/77  Pulse: 68  Weight: 102 lb (46.3 kg)    Fetal Status: Fetal Heart Rate (bpm): 132 Fundal Height: 28 cm Movement: Present     General:  Alert, oriented and cooperative. Patient is in no acute distress.  Skin: Skin is warm and dry. No rash noted.   Cardiovascular: Normal heart rate noted  Respiratory: Normal respiratory effort, no problems with respiration noted  Abdomen: Soft, gravid, appropriate for gestational age. Pain/Pressure: Absent     Pelvic: Vag. Bleeding: None     Cervical exam deferred        Extremities: Normal range of motion.  Edema: None  Mental Status: Normal mood and affect. Normal behavior. Normal judgment and thought content.    Assessment and Plan:  Pregnancy: G2P1001 at [redacted]w[redacted]d  1. Supervision of other normal pregnancy, antepartum -Tdap declined today, long discussion on benefits by RN and provider and strongly recommended, VIS given for patient review -pt continues to decline flu vaccine today -pt reports she is moving to [place] next month, likely by 06/20/2019  2. Short cervix affecting pregnancy -from MFM TVUS on  04/05/2019, CL 2.7cm, normal length, f/u as clinically indicated  3. Hemoglobin S (Hb-S) trait (HCC) -urine culture today  4. Seizures ([place]) -no seizures since age 69, no official diagnosis, pt continues to report no seizure activity today  Preterm labor symptoms and general obstetric precautions including but not limited to vaginal bleeding, contractions, leaking of fluid and fetal movement were reviewed in detail with the patient. Please refer to After Visit Summary for other counseling recommendations.  Return in about 2 weeks (around 06/15/2019) for in-person ROB.   , Gerrie Nordmann, NP

## 2019-06-01 ENCOUNTER — Other Ambulatory Visit: Payer: Self-pay

## 2019-06-01 ENCOUNTER — Encounter: Payer: Self-pay | Admitting: Women's Health

## 2019-06-01 ENCOUNTER — Ambulatory Visit (INDEPENDENT_AMBULATORY_CARE_PROVIDER_SITE_OTHER): Payer: Medicaid Other | Admitting: Women's Health

## 2019-06-01 ENCOUNTER — Other Ambulatory Visit: Payer: Medicaid Other

## 2019-06-01 VITALS — BP 121/77 | HR 68 | Wt 102.0 lb

## 2019-06-01 DIAGNOSIS — Z3480 Encounter for supervision of other normal pregnancy, unspecified trimester: Secondary | ICD-10-CM

## 2019-06-01 DIAGNOSIS — O26873 Cervical shortening, third trimester: Secondary | ICD-10-CM

## 2019-06-01 DIAGNOSIS — R569 Unspecified convulsions: Secondary | ICD-10-CM

## 2019-06-01 DIAGNOSIS — Z3A28 28 weeks gestation of pregnancy: Secondary | ICD-10-CM

## 2019-06-01 DIAGNOSIS — D573 Sickle-cell trait: Secondary | ICD-10-CM

## 2019-06-01 DIAGNOSIS — O26879 Cervical shortening, unspecified trimester: Secondary | ICD-10-CM

## 2019-06-01 NOTE — Patient Instructions (Addendum)
The Maternity Assessment Unit (MAU) is located at the [facility] at [facility]. The address is: [address], [place], [place], [place] [address]. Please see map below for additional directions.    The Maternity Assessment Unit is designed to help you during your pregnancy, and for up to 6 weeks after delivery, with any pregnancy- or postpartum-related emergencies, if you think you are in labor, or if your water has broken. For example, if you experience nausea and vomiting, vaginal bleeding, severe abdominal or pelvic pain, elevated blood pressure or other problems related to your pregnancy or postpartum time, please come to the Maternity Assessment Unit for assistance. https://www.cdc.gov/vaccines/hcp/vis/vis-statements/tdap.pdf">  Tdap Vaccine (Tetanus, Diphtheria and Pertussis): What You Need to Know 1. Why get vaccinated? Tetanus, diphtheria and pertussis are very serious diseases. Tdap vaccine can protect [place] from these diseases. And, Tdap vaccine given to pregnant women can protect newborn babies against pertussis.Marland Kitchen TETANUS (Lockjaw) is rare in the [place] today. It causes painful muscle tightening and stiffness, usually all over the body.  It can lead to tightening of muscles in the head and neck so you can't open your mouth, swallow, or sometimes even breathe. Tetanus kills about 1 out of 10 people who are infected even after receiving the best medical care. DIPHTHERIA is also rare in the [place] today. It can cause a thick coating to form in the back of the throat.  It can lead to breathing problems, heart failure, paralysis, and death. PERTUSSIS (Whooping Cough) causes severe coughing spells, which can cause difficulty breathing, vomiting and disturbed sleep.  It can also lead to weight loss, incontinence, and rib fractures. Up to 2 in 100 adolescents and 5 in 100 adults with pertussis are hospitalized or have complications, which could  include pneumonia or death. These diseases are caused by bacteria. Diphtheria and pertussis are spread from person to person through secretions from coughing or sneezing. Tetanus enters the body through cuts, scratches, or wounds. Before vaccines, as many as 200,000 cases of diphtheria, 200,000 cases of pertussis, and hundreds of cases of tetanus, were reported in the [place] each year. Since vaccination began, reports of cases for tetanus and diphtheria have dropped by about 99% and for pertussis by about 80%. 2. Tdap vaccine Tdap vaccine can protect adolescents and adults from tetanus, diphtheria, and pertussis. One dose of Tdap is routinely given at age 70 or 54. People who did not get Tdap at that age should get it as soon as possible. Tdap is especially important for healthcare professionals and anyone having close contact with a baby younger than 12 months. Pregnant women should get a dose of Tdap during every pregnancy, to protect the newborn from pertussis. Infants are most at risk for severe, life-threatening complications from pertussis. Another vaccine, called Td, protects against tetanus and diphtheria, but not pertussis. A Td booster should be given every 10 years. Tdap may be given as one of these boosters if you have never gotten Tdap before. Tdap may also be given after a severe cut or burn to prevent tetanus infection. Your doctor or the person giving you the vaccine can give you more information. Tdap may safely be given at the same time as other vaccines. 3. Some people should not get this vaccine  A person who has ever had a life-threatening allergic reaction after a previous dose of any diphtheria, tetanus or pertussis containing vaccine, OR has a severe allergy to any part of this vaccine,  should not get Tdap vaccine. Tell the person giving the vaccine about any severe allergies.  Anyone who had coma or long repeated seizures within 7 days after a childhood dose of DTP or  DTaP, or a previous dose of Tdap, should not get Tdap, unless a cause other than the vaccine was found. They can still get Td.  Talk to your doctor if you: ? have seizures or another nervous system problem, ? had severe pain or swelling after any vaccine containing diphtheria, tetanus or pertussis, ? ever had a condition called Guillain-Barr Syndrome (GBS), ? aren't feeling well on the day the shot is scheduled. 4. Risks With any medicine, including vaccines, there is a chance of side effects. These are usually mild and go away on their own. Serious reactions are also possible but are rare. Most people who get Tdap vaccine do not have any problems with it. Mild problems following Tdap (Did not interfere with activities)  Pain where the shot was given (about 3 in 4 adolescents or 2 in 3 adults)  Redness or swelling where the shot was given (about 1 person in 5)  Mild fever of at least 100.15F (up to about 1 in 25 adolescents or 1 in 100 adults)  Headache (about 3 or 4 people in 10)  Tiredness (about 1 person in 3 or 4)  Nausea, vomiting, diarrhea, stomach ache (up to 1 in 4 adolescents or 1 in 10 adults)  Chills, sore joints (about 1 person in 10)  Body aches (about 1 person in 3 or 4)  Rash, swollen glands (uncommon) Moderate problems following Tdap (Interfered with activities, but did not require medical attention)  Pain where the shot was given (up to 1 in 5 or 6)  Redness or swelling where the shot was given (up to about 1 in 16 adolescents or 1 in 12 adults)  Fever over 102F (about 1 in 100 adolescents or 1 in 250 adults)  Headache (about 1 in 7 adolescents or 1 in 10 adults)  Nausea, vomiting, diarrhea, stomach ache (up to 1 or 3 people in 100)  Swelling of the entire arm where the shot was given (up to about 1 in 500). Severe problems following Tdap (Unable to perform usual activities; required medical attention)  Swelling, severe pain, bleeding and redness in  the arm where the shot was given (rare). Problems that could happen after any vaccine:  People sometimes faint after a medical procedure, including vaccination. Sitting or lying down for about 15 minutes can help prevent fainting, and injuries caused by a fall. Tell your doctor if you feel dizzy, or have vision changes or ringing in the ears.  Some people get severe pain in the shoulder and have difficulty moving the arm where a shot was given. This happens very rarely.  Any medication can cause a severe allergic reaction. Such reactions from a vaccine are very rare, estimated at fewer than 1 in a million doses, and would happen within a few minutes to a few hours after the vaccination. As with any medicine, there is a very remote chance of a vaccine causing a serious injury or death. The safety of vaccines is always being monitored. For more information, visit: http://www.aguilar.org/ 5. What if there is a serious problem? What should I look for?  Look for anything that concerns you, such as signs of a severe allergic reaction, very high fever, or unusual behavior. Signs of a severe allergic reaction can include hives, swelling of the face  and throat, difficulty breathing, a fast heartbeat, dizziness, and weakness. These would usually start a few minutes to a few hours after the vaccination. What should I do?  If you think it is a severe allergic reaction or other emergency that can't wait, call 9-1-1 or get the person to the nearest hospital. Otherwise, call your doctor.  Afterward, the reaction should be reported to the Vaccine Adverse Event Reporting System (VAERS). Your doctor might file this report, or you can do it yourself through the VAERS web site at www.vaers.LAgents.nohhs.gov, or by calling 1-(412)277-8021. VAERS does not give medical advice. 6. The National Vaccine Injury Compensation Program The [employer] Vaccine Injury Compensation Program (VICP) is a federal program that was created to  compensate people who may have been injured by certain vaccines. Persons who believe they may have been injured by a vaccine can learn about the program and about filing a claim by calling 1-941 464 4555 or visiting the VICP website at SpiritualWord.atwww.hrsa.gov/vaccinecompensation. There is a time limit to file a claim for compensation. 7. How can I learn more?  Ask your doctor. He or she can give you the vaccine package insert or suggest other sources of information.  Call your local or state health department.  Contact the Centers for Disease Control and Prevention (CDC): ? Call 586-251-80061-463-885-9642 (1-800-CDC-INFO) or ? Visit CDC's website at PicCapture.uywww.cdc.gov/vaccines Vaccine Information Statement Tdap Vaccine (09/28/2013) This information is not intended to replace advice given to you by your health care provider. Make sure you discuss any questions you have with your health care provider. Document Released: 01/21/2012 Document Revised: 03/09/2018 Document Reviewed: 03/09/2018 Elsevier Interactive Patient Education  2020 [employer]  Preterm Labor and Birth Information  The normal length of a pregnancy is 39-41 weeks. Preterm labor is when labor starts before 37 completed weeks of pregnancy. What are the risk factors for preterm labor? Preterm labor is more likely to occur in women who:  Have certain infections during pregnancy such as a bladder infection, sexually transmitted infection, or infection inside the uterus (chorioamnionitis).  Have a shorter-than-normal cervix.  Have gone into preterm labor before.  Have had surgery on their cervix.  Are younger than age 417 or older than age 23.  Are African American.  Are pregnant with twins or multiple babies (multiple gestation).  Take street drugs or smoke while pregnant.  Do not gain enough weight while pregnant.  Became pregnant shortly after having been pregnant. What are the symptoms of preterm labor? Symptoms of preterm labor include:   Cramps similar to those that can happen during a menstrual period. The cramps may happen with diarrhea.  Pain in the abdomen or lower back.  Regular uterine contractions that may feel like tightening of the abdomen.  A feeling of increased pressure in the pelvis.  Increased watery or bloody mucus discharge from the vagina.  Water breaking (ruptured amniotic sac). Why is it important to recognize signs of preterm labor? It is important to recognize signs of preterm labor because babies who are born prematurely may not be fully developed. This can put them at an increased risk for:  Long-term (chronic) heart and lung problems.  Difficulty immediately after birth with regulating body systems, including blood sugar, body temperature, heart rate, and breathing rate.  Bleeding in the brain.  Cerebral palsy.  Learning difficulties.  Death. These risks are highest for babies who are born before 34 weeks of pregnancy. How is preterm labor treated? Treatment depends on the length of your pregnancy, your  condition, and the health of your baby. It may involve:  Having a stitch (suture) placed in your cervix to prevent your cervix from opening too early (cerclage).  Taking or being given medicines, such as: ? Hormone medicines. These may be given early in pregnancy to help support the pregnancy. ? Medicine to stop contractions. ? Medicines to help mature the baby's lungs. These may be prescribed if the risk of delivery is high. ? Medicines to prevent your baby from developing cerebral palsy. If the labor happens before 34 weeks of pregnancy, you may need to stay in the hospital. What should I do if I think I am in preterm labor? If you think that you are going into preterm labor, call your health care provider right away. How can I prevent preterm labor in future pregnancies? To increase your chance of having a full-term pregnancy:  Do not use any tobacco products, such as cigarettes,  chewing tobacco, and e-cigarettes. If you need help quitting, ask your health care provider.  Do not use street drugs or medicines that have not been prescribed to you during your pregnancy.  Talk with your health care provider before taking any herbal supplements, even if you have been taking them regularly.  Make sure you gain a healthy amount of weight during your pregnancy.  Watch for infection. If you think that you might have an infection, get it checked right away.  Make sure to tell your health care provider if you have gone into preterm labor before. This information is not intended to replace advice given to you by your health care provider. Make sure you discuss any questions you have with your health care provider. Document Released: 10/12/2003 Document Revised: 11/13/2018 Document Reviewed: 12/13/2015 Elsevier Patient Education  2020 [employer].

## 2019-06-02 LAB — CBC
Hematocrit: 35 % (ref 34.0–46.6)
Hemoglobin: 11.8 g/dL (ref 11.1–15.9)
MCH: 27.2 pg (ref 26.6–33.0)
MCHC: 33.7 g/dL (ref 31.5–35.7)
MCV: 81 fL (ref 79–97)
Platelets: 216 10*3/uL (ref 150–450)
RBC: 4.34 x10E6/uL (ref 3.77–5.28)
RDW: 14.2 % (ref 11.7–15.4)
WBC: 7 10*3/uL (ref 3.4–10.8)

## 2019-06-02 LAB — RPR: RPR Ser Ql: NONREACTIVE

## 2019-06-02 LAB — GLUCOSE TOLERANCE, 2 HOURS W/ 1HR
Glucose, 1 hour: 95 mg/dL (ref 65–179)
Glucose, 2 hour: 86 mg/dL (ref 65–152)
Glucose, Fasting: 70 mg/dL (ref 65–91)

## 2019-06-02 LAB — HIV ANTIBODY (ROUTINE TESTING W REFLEX): HIV Screen 4th Generation wRfx: NONREACTIVE

## 2019-06-25 NOTE — Progress Notes (Signed)
This is a duplicated encounter, please refer to other encounter with same date for provider notes.

## 2019-06-29 ENCOUNTER — Encounter: Payer: Medicaid Other | Admitting: Medical

## 2020-01-18 ENCOUNTER — Other Ambulatory Visit: Payer: Self-pay

## 2020-01-18 ENCOUNTER — Ambulatory Visit (HOSPITAL_COMMUNITY)
Admission: EM | Admit: 2020-01-18 | Discharge: 2020-01-18 | Disposition: A | Discharge status: Home / Self Care | Payer: BLUE CROSS/BLUE SHIELD | Attending: Family Medicine | Admitting: Family Medicine

## 2020-01-18 ENCOUNTER — Encounter (HOSPITAL_COMMUNITY): Payer: Self-pay

## 2020-01-18 DIAGNOSIS — Z3202 Encounter for pregnancy test, result negative: Secondary | ICD-10-CM | POA: Diagnosis not present

## 2020-01-18 DIAGNOSIS — N898 Other specified noninflammatory disorders of vagina: Secondary | ICD-10-CM | POA: Insufficient documentation

## 2020-01-18 HISTORY — DX: Essential (primary) hypertension: I10

## 2020-01-18 LAB — POC URINE PREG, ED: Preg Test, Ur: NEGATIVE

## 2020-01-18 MED ORDER — CEFTRIAXONE SODIUM 500 MG IJ SOLR
INTRAMUSCULAR | Status: AC
  Filled 2020-01-18: qty 500

## 2020-01-18 MED ORDER — AZITHROMYCIN 250 MG PO TABS
1000.0000 mg | ORAL_TABLET | Freq: Once | ORAL | Status: AC
  Administered 2020-01-18: 1000 mg via ORAL

## 2020-01-18 MED ORDER — LIDOCAINE HCL (PF) 1 % IJ SOLN
INTRAMUSCULAR | Status: AC
  Filled 2020-01-18: qty 2

## 2020-01-18 MED ORDER — CEFTRIAXONE SODIUM 500 MG IJ SOLR
500.0000 mg | Freq: Once | INTRAMUSCULAR | Status: AC
  Administered 2020-01-18: 500 mg via INTRAMUSCULAR

## 2020-01-18 MED ORDER — AZITHROMYCIN 250 MG PO TABS
ORAL_TABLET | ORAL | Status: AC
  Filled 2020-01-18: qty 4

## 2020-01-18 NOTE — Discharge Instructions (Addendum)
Sending a swab for testing and checking for STDs. We will call you with any positive results Treating you for gonorrhea and chlamydia today.

## 2020-01-18 NOTE — ED Provider Notes (Addendum)
[place]    CSN: [id number] Arrival date & time: 01/18/20  0856      History   Chief Complaint Chief Complaint  Patient presents with  . Exposure to STD    HPI IVANNIA WILLHELM is a 24 y.o. female.   Patient is a 24 year old female that presents today with intermittent lower abdominal cramping, green vaginal discharge x2 days.  Concern for STDs due to new partner.  Currently breast-feeding. No LMP recorded (lmp unknown). Patient has had an implant. No current abdominal pain, dysuria, hematuria or urinary frequency.  No fevers.  Recent miscarriage  ROS per HPI      Past Medical History:  Diagnosis Date  . Hypertension   . Seizures ([place])   . Sickle cell trait [facility])     Patient Active Problem List   Diagnosis Date Noted  . Supervision of other normal pregnancy, antepartum 01/19/2019  . Seizures ([place])   . Bacterial vaginosis 12/27/2018  . Intrauterine pregnancy 12/27/2018  . Subchorionic hematoma 12/27/2018  . 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 Short cervix affecting pregnancy 05/30/2014    No past surgical history on file.  OB History    Gravida  2   Para  1   Term  1   Preterm      AB      Living  1     SAB      TAB      Ectopic      Multiple  0   Live Births  1            Home Medications    Prior to Admission medications   Not on File    Family History Family History  Problem Relation Age of Onset  . Sickle cell trait Mother   . Sickle cell trait Sister     Social History Social History   Tobacco Use  . Smoking status: Never Smoker  . Smokeless tobacco: Never Used  Vaping Use  . Vaping Use: Never used  Substance Use Topics  . Alcohol use: No  . Drug use: No     Allergies   Orange fruit [citrus]   Review of Systems Review of Systems   Physical Exam Triage Vital Signs ED Triage Vitals  Enc Vitals Group     BP 01/18/20 0921 (!) 142/92     Pulse Rate 01/18/20 0921 63     Resp  01/18/20 0921 16     Temp 01/18/20 0921 (!) 97 F (36.1 C)     Temp src --      SpO2 01/18/20 0921 99 %     Weight --      Height --      Head Circumference --      Peak Flow --      Pain Score 01/18/20 0922 0     Pain Loc --      Pain Edu? --      Excl. in [place]? --    No data found.  Updated Vital Signs BP (!) 142/92   Pulse 63   Temp (!) 97 F (36.1 C)   Resp 16   LMP  (LMP Unknown)   SpO2 99%   Visual Acuity Right Eye Distance:   Left Eye Distance:   Bilateral Distance:    Right Eye Near:   Left Eye Near:    Bilateral Near:     Physical Exam Vitals and nursing note reviewed.  Constitutional:  General: She is not in acute distress.    Appearance: Normal appearance. She is not ill-appearing, toxic-appearing or diaphoretic.  HENT:     Head: Normocephalic.     Nose: Nose normal.     Mouth/Throat:     Pharynx: Oropharynx is clear.  Eyes:     Conjunctiva/sclera: Conjunctivae normal.  Pulmonary:     Effort: Pulmonary effort is normal.  Abdominal:     Palpations: Abdomen is soft.     Tenderness: There is no abdominal tenderness.  Musculoskeletal:        General: Normal range of motion.     Cervical back: Normal range of motion.  Skin:    General: Skin is warm and dry.     Findings: No rash.  Neurological:     Mental Status: She is alert.  Psychiatric:        Mood and Affect: Mood normal.      UC Treatments / Results  Labs (all labs ordered are listed, but only abnormal results are displayed) Labs Reviewed  POC URINE PREG, ED  CERVICOVAGINAL ANCILLARY ONLY    EKG   Radiology No results found.  Procedures Procedures (including critical care time)  Medications Ordered in UC Medications  cefTRIAXone (ROCEPHIN) injection 500 mg (500 mg Intramuscular Given 01/18/20 0957)  azithromycin (ZITHROMAX) tablet 1,000 mg (1,000 mg Oral Given 01/18/20 0957)    Initial Impression / Assessment and Plan / UC Course  I have reviewed the triage vital  signs and the nursing notes.  Pertinent labs & imaging results that were available during my care of the patient were reviewed by me and considered in my medical decision making (see chart for details).     Vaginal discharge Based on description of discharge concern for STDs or bacterial infection. We will go ahead and cover for gonorrhea and chlamydia here today No concern for PID. We will hold off on treating for bacterial vaginosis and trichomonas due to patient breast-feeding Swab sent for testing with labs pending Final Clinical Impressions(s) / UC Diagnoses   Final diagnoses:  Vaginal discharge     Discharge Instructions     Sending a swab for testing and checking for STDs. We will call you with any positive results Treating you for gonorrhea and chlamydia today.      ED Prescriptions    None     PDMP not reviewed this encounter.   Janace Aris, NP 01/18/20 1120    Dahlia Byes A, NP 01/18/20 1120

## 2020-01-18 NOTE — ED Triage Notes (Signed)
Pt requesting STD testing, c/o lower abdominal cramping and green vaginal discharge x 2 days. Denies dysuria or urinary frequency.

## 2020-01-26 ENCOUNTER — Telehealth (HOSPITAL_COMMUNITY): Payer: Self-pay | Admitting: Orthopedic Surgery

## 2020-01-26 NOTE — Telephone Encounter (Signed)
Notified by RN that lab was unable to process Cervicovaginal  specimen and pt needs to return to UC to have another specimen recollected.  Pt notified and states she will return to clinic.Pt verbalized understanding and had all questions answered.

## 2020-01-27 ENCOUNTER — Ambulatory Visit (HOSPITAL_COMMUNITY)
Admission: EM | Admit: 2020-01-27 | Discharge: 2020-01-27 | Disposition: A | Discharge status: Home / Self Care | Payer: BLUE CROSS/BLUE SHIELD | Attending: Family Medicine | Admitting: Family Medicine

## 2020-01-27 DIAGNOSIS — A5901 Trichomonal vulvovaginitis: Secondary | ICD-10-CM | POA: Diagnosis not present

## 2020-01-27 DIAGNOSIS — Z113 Encounter for screening for infections with a predominantly sexual mode of transmission: Secondary | ICD-10-CM | POA: Diagnosis present

## 2020-01-27 NOTE — ED Notes (Signed)
Patient was called back to recollect vaginal swab.  Swab collected, labeled

## 2020-01-28 ENCOUNTER — Telehealth (HOSPITAL_COMMUNITY): Payer: Self-pay | Admitting: Orthopedic Surgery

## 2020-01-28 LAB — CERVICOVAGINAL ANCILLARY ONLY
Bacterial Vaginitis (gardnerella): NEGATIVE
Candida Glabrata: NEGATIVE
Candida Vaginitis: NEGATIVE
Chlamydia: NEGATIVE
Comment: NEGATIVE
Comment: NEGATIVE
Comment: NEGATIVE
Comment: NEGATIVE
Comment: NEGATIVE
Comment: NORMAL
Neisseria Gonorrhea: NEGATIVE
Trichomonas: POSITIVE — AB

## 2020-01-28 MED ORDER — METRONIDAZOLE 500 MG PO TABS: 2000.0000 mg | ORAL_TABLET | Freq: Two times a day (BID) | ORAL | 0 refills | Status: AC

## 2020-01-28 NOTE — Telephone Encounter (Signed)
Treating for trichomoniasis

## 2020-02-19 IMAGING — CT CT HEAD W/O CM
3 series · 16 of 47 positions shown, 19 images · non-contrast
Comparison: None

CLINICAL DATA: RIGHT arm numbness, BILATERAL leg weakness, focal
neural deficit of more than 6 hours suspected stroke, history of
seizures, sickle trait

EXAM:
CT HEAD WITHOUT CONTRAST
TECHNIQUE: Contiguous axial images were obtained from the base of the skull
through the vertex without intravenous contrast. Sagittal and
coronal MPR images reconstructed from axial data set.

[Series 3: head 5.0 h30s · axial · 0.39mm/px · z∈[-129,+1]mm · 10 of 32 slices shown, 13 images]
[im 3/32  brain]
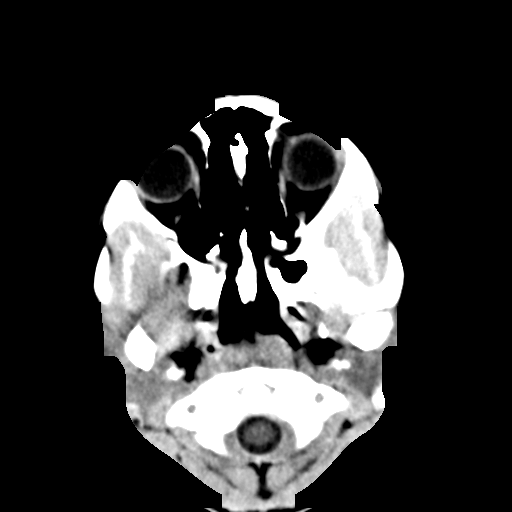
[im 3/32  bone]
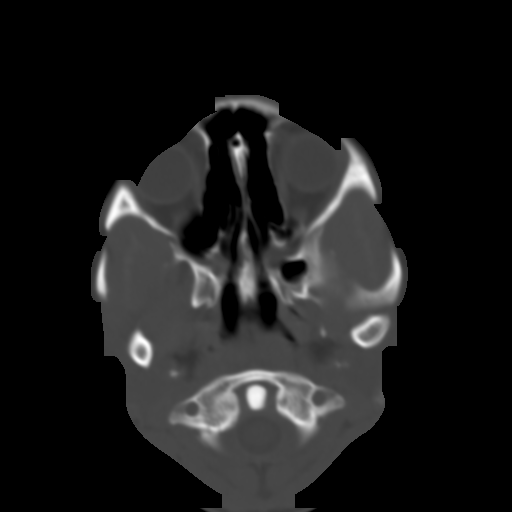
[im 6/32  brain]
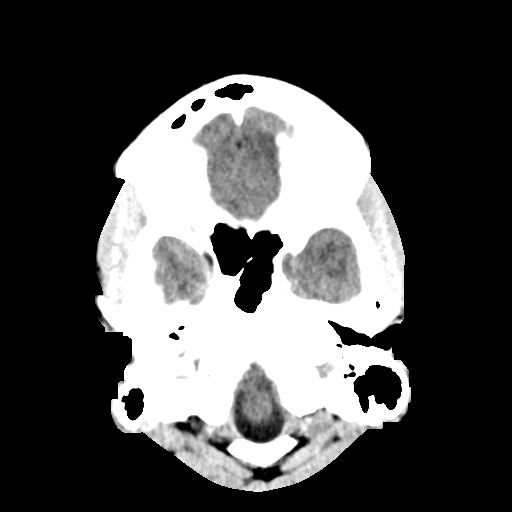
[im 9/32  brain]
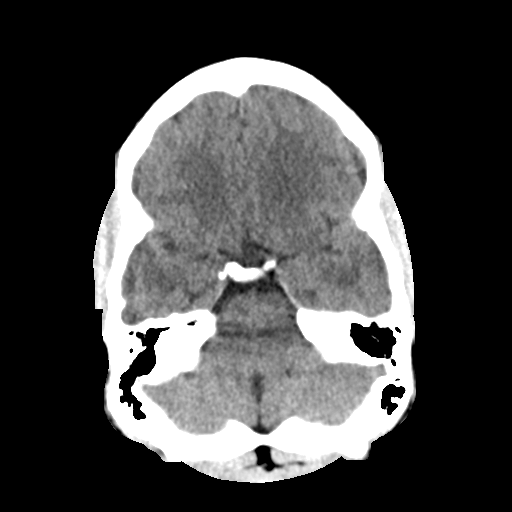
[im 11/32  brain]
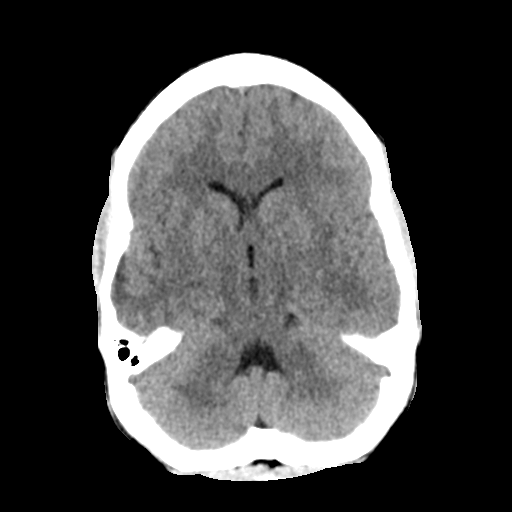
[im 14/32  brain]
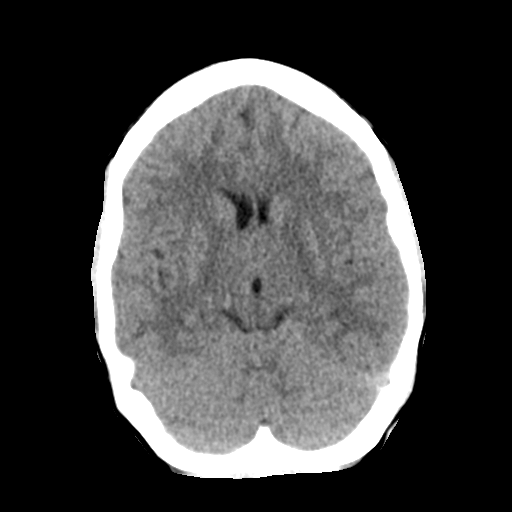
[im 14/32  bone]
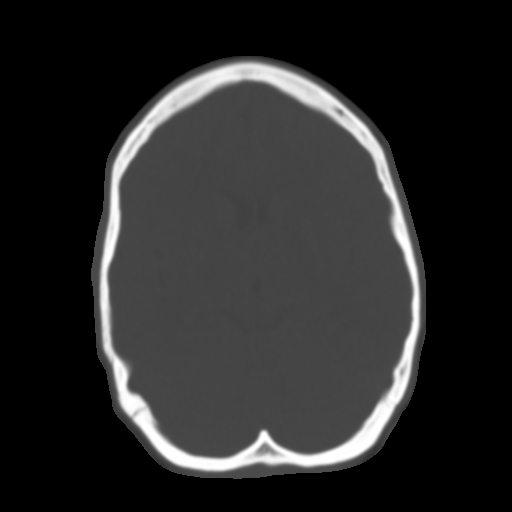
[im 18/32  brain]
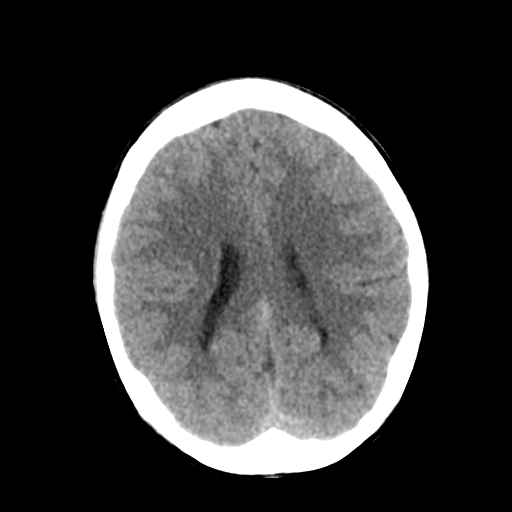
[im 21/32  brain]
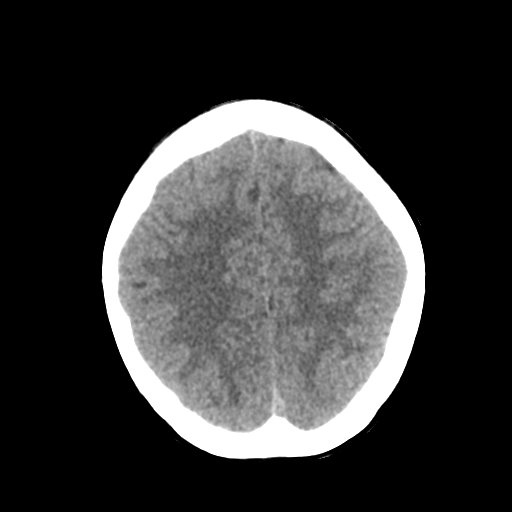
[im 24/32  brain]
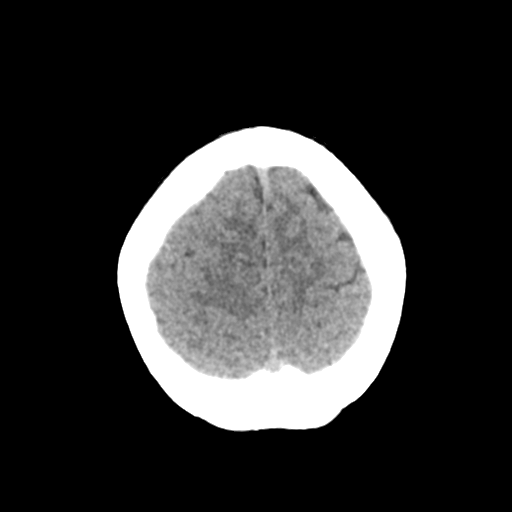
[im 26/32  brain]
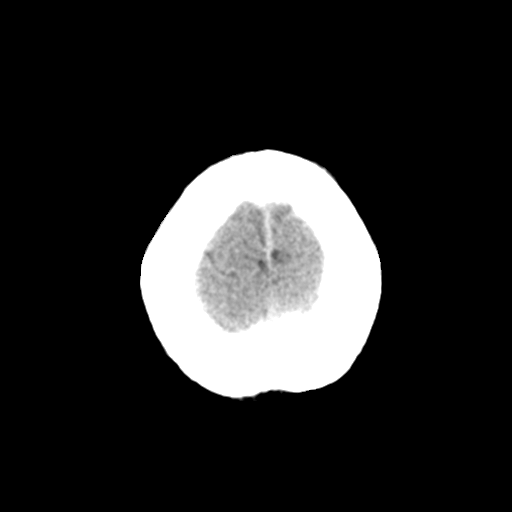
[im 26/32  bone]
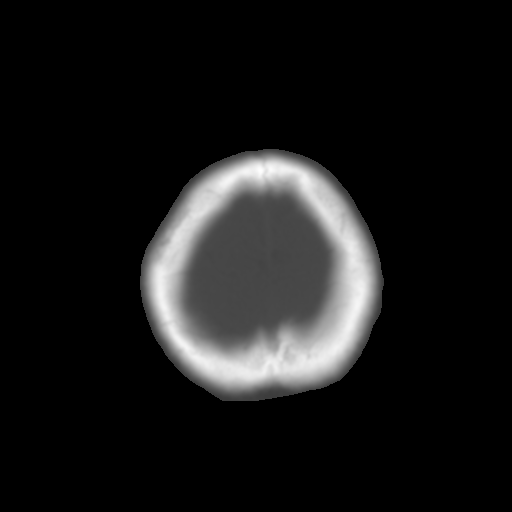
[im 29/32  brain]
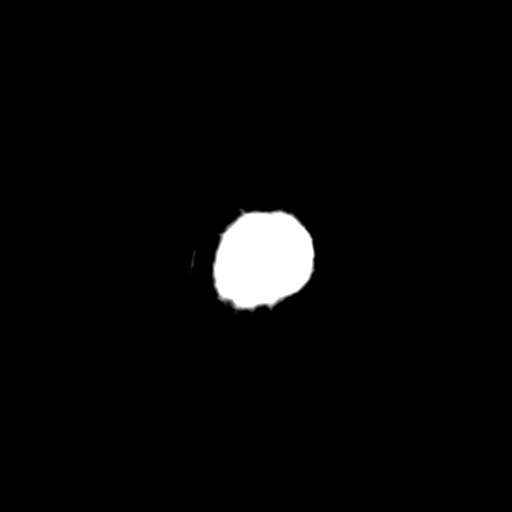

[Series 5: head 3.0 mpr cor · coronal · 0.37mm/px · 3 of 76 slices shown]
[im 31/76  brain]
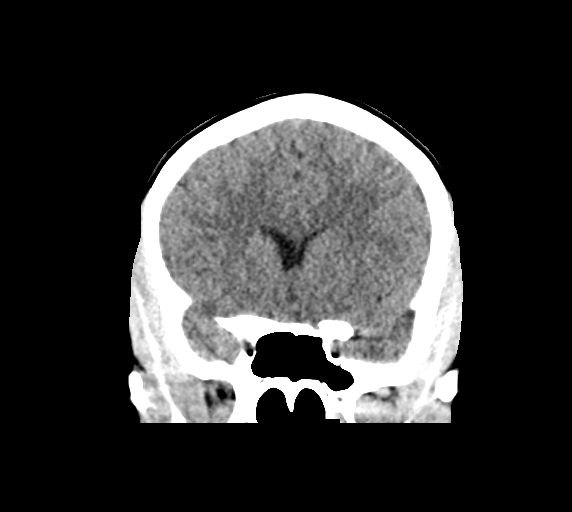
[im 36/76  brain]
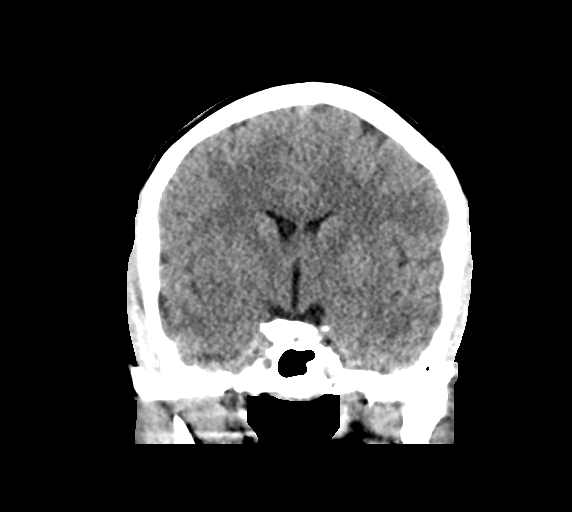
[im 41/76  brain]
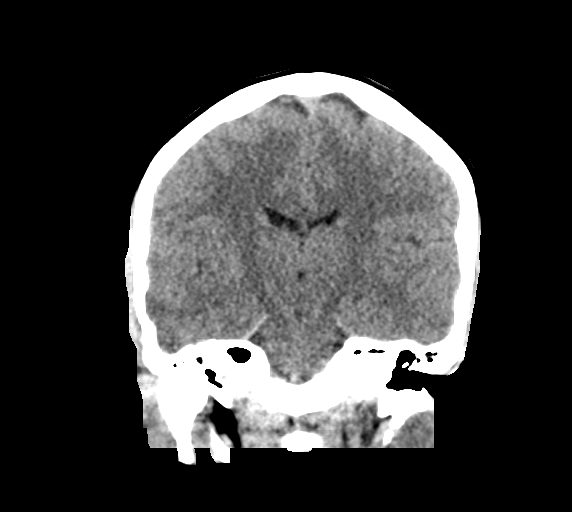

[Series 6: head 3.0 mpr sag · sagittal · 0.33mm/px · 3 of 67 slices shown]
[im 23/67  brain]
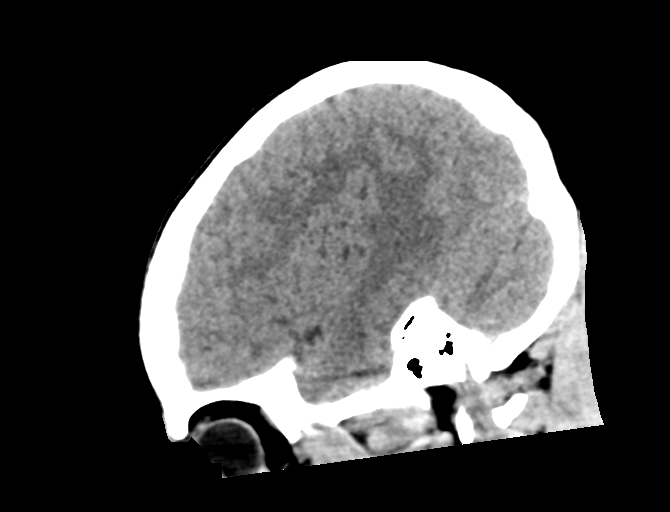
[im 34/67  brain]
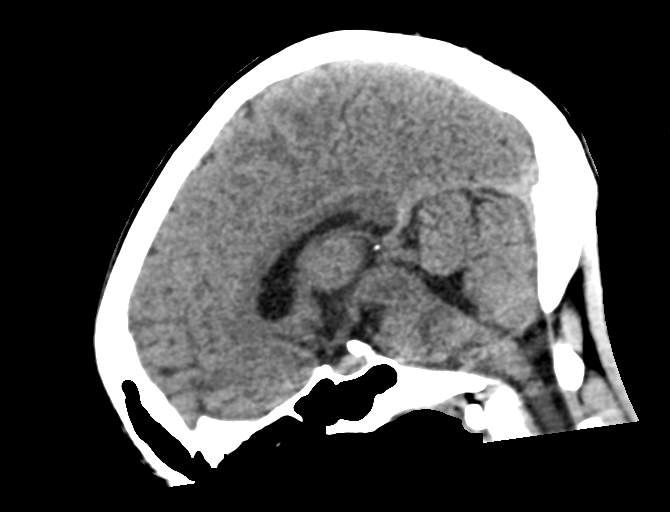
[im 45/67  brain]
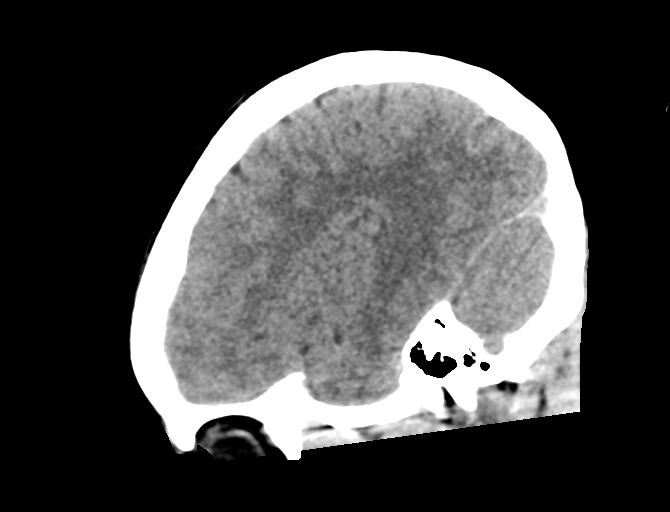

[16 of 47 positions shown; findings below may reference images not displayed]

FINDINGS: Brain: Normal ventricular morphology. No midline shift or mass
effect. Normal appearance of brain parenchyma. No intracranial
hemorrhage, mass lesion, evidence of acute infarction, or
extra-axial fluid collection.

Vascular: Unremarkable

Skull: Intact

Sinuses/Orbits: Clear

Other: N/A
IMPRESSION: Normal exam.

## 2020-03-17 ENCOUNTER — Other Ambulatory Visit: Payer: Self-pay

## 2020-03-17 ENCOUNTER — Ambulatory Visit (HOSPITAL_COMMUNITY)
Admission: EM | Admit: 2020-03-17 | Discharge: 2020-03-17 | Disposition: A | Discharge status: Home / Self Care | Payer: BLUE CROSS/BLUE SHIELD | Attending: Urgent Care | Admitting: Urgent Care

## 2020-03-17 ENCOUNTER — Encounter (HOSPITAL_COMMUNITY): Payer: Self-pay

## 2020-03-17 DIAGNOSIS — R5383 Other fatigue: Secondary | ICD-10-CM

## 2020-03-17 DIAGNOSIS — J3089 Other allergic rhinitis: Secondary | ICD-10-CM

## 2020-03-17 MED ORDER — AMLODIPINE BESYLATE 2.5 MG PO TABS: 2.5000 mg | ORAL_TABLET | Freq: Every day | ORAL | 0 refills | Status: AC

## 2020-03-17 MED ORDER — CETIRIZINE HCL 10 MG PO TABS: 10.0000 mg | ORAL_TABLET | Freq: Every day | ORAL | 0 refills | Status: AC

## 2020-03-17 NOTE — ED Triage Notes (Signed)
Pt c/o fatiguex2 wks. Pt denies any other sx.

## 2020-03-17 NOTE — ED Provider Notes (Signed)
[facility]   MRN: [id number] DOB: 10/14/1995  Subjective:   Valerie Brooks is a 24 y.o. female presenting for 2-week history of persistent fatigue.  Patient also has chronic allergies.  States that she has a very stressful lifestyle.  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  She also has a job.  States that she does not sleep well, gets 2 to 5 hours asleep, does not eat well.  Denies fever, headache, confusion, chest pain, shortness of breath, nausea, vomiting, belly pain, bruising, bleeding, dysuria, hematuria.  In the past 2 years she has had multiple CBCs, TSH and metabolic panels.  All were negative.  She does have a history of hypertension and used to take medication for this but stopped this on her own.  Denies taking chronic medications.  Allergies  Allergen Reactions  . Orange Fruit [Citrus] Swelling and Rash    Past Medical History:  Diagnosis Date  . Hypertension   . Seizures (HCC)   . Sickle cell trait (HCC)      History reviewed. No pertinent surgical history.  Family History  Problem Relation Age of Onset  . Sickle cell trait Mother   . Sickle cell trait Sister     Social History   Tobacco Use  . Smoking status: Never Smoker  . Smokeless tobacco: Never Used  Vaping Use  . Vaping Use: Never used  Substance Use Topics  . Alcohol use: No  . Drug use: No    ROS   Objective:   Vitals: BP (!) 145/82   Pulse 75   Temp 98.8 F (37.1 C) (Oral)   Resp 16   Ht 5' (1.524 m)   Wt 100 lb (45.4 kg)   SpO2 100%   BMI 19.53 kg/m   Wt Readings from Last 3 Encounters:  03/17/20 100 lb (45.4 kg)  06/01/19 102 lb (46.3 kg)  01/19/19 97 lb 6.4 oz (44.2 kg)   Temp Readings from Last 3 Encounters:  03/17/20 98.8 F (37.1 C) (Oral)  01/18/20 (!) 97 F (36.1 C)  04/05/19 98.2 F (36.8 C)   BP Readings from Last 3 Encounters:  03/17/20 (!) 145/82  01/18/20 (!) 142/92  06/01/19 121/77   Pulse Readings from Last 3 Encounters:   03/17/20 75  01/18/20 63  06/01/19 68    Physical Exam Constitutional:      General: She is not in acute distress.    Appearance: Normal appearance. She is well-developed. She is not ill-appearing, toxic-appearing or diaphoretic.  HENT:     Head: Normocephalic and atraumatic.     Right Ear: External ear normal.     Left Ear: External ear normal.     Nose: Nose normal.     Mouth/Throat:     Mouth: Mucous membranes are moist.  Eyes:     General: No scleral icterus.       Right eye: No discharge.        Left eye: No discharge.     Extraocular Movements: Extraocular movements intact.     Conjunctiva/sclera: Conjunctivae normal.     Pupils: Pupils are equal, round, and reactive to light.  Neck:     Thyroid: No thyroid mass, thyromegaly or thyroid tenderness.  Cardiovascular:     Rate and Rhythm: Normal rate and regular rhythm.     Pulses: Normal pulses.     Heart sounds: Normal heart sounds. No murmur heard.  No friction rub. No gallop.   Pulmonary:  Effort: Pulmonary effort is normal. No respiratory distress.     Breath sounds: Normal breath sounds. No stridor. No wheezing, rhonchi or rales.  Musculoskeletal:     Cervical back: Normal range of motion and neck supple.  Skin:    General: Skin is warm and dry.     Coloration: Skin is not jaundiced or pale.     Findings: No bruising, erythema, lesion or rash.  Neurological:     Mental Status: She is alert and oriented to person, place, and time.     Cranial Nerves: No cranial nerve deficit.     Motor: No weakness.     Coordination: Coordination normal.     Gait: Gait normal.     Deep Tendon Reflexes: Reflexes normal.  Psychiatric:        Mood and Affect: Mood normal.        Behavior: Behavior normal.        Thought Content: Thought content normal.        Judgment: Judgment normal.     Assessment and Plan :   PDMP not reviewed this encounter.  1. Fatigue, unspecified type   2. Allergic rhinitis due to other  allergic trigger, unspecified seasonality     Suspect the patient is not sleeping for hydrating well, eating enough due to her stressful lifestyle of having children, a job and taking care of them all by herself.  Recommended better self-care.  Start Zyrtec once daily.  Reviewed labs, recommend setting up PCP with new provider.  Start low-dose amlodipine in the meantime.  Counseled patient on potential for adverse effects with medications prescribed/recommended today, ER and return-to-clinic precautions discussed, patient verbalized understanding.    Wallis Bamberg, PA-C 03/17/20 2019

## 2020-05-28 IMAGING — US OBSTETRIC <14 WK US AND TRANSVAGINAL OB US
1 series · 15 of 28 positions shown · non-contrast
Comparison: None.

CLINICAL DATA: Abdominal pain and vaginal spotting. Positive
pregnancy test.

EXAM:
OBSTETRIC <14 WK US AND TRANSVAGINAL OB US
TECHNIQUE: Both transabdominal and transvaginal ultrasound examinations were
performed for complete evaluation of the gestation as well as the
maternal uterus, adnexal regions, and pelvic cul-de-sac.
Transvaginal technique was performed to assess early pregnancy.

[Series 1: obstetric <14 wk us and transvaginal ob us · 61 acquisitions, 15 frames shown]
[im 1/61]
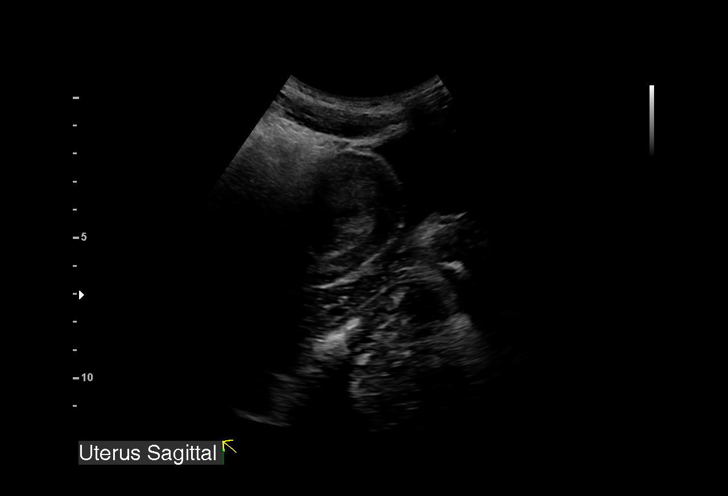
[im 5/61]
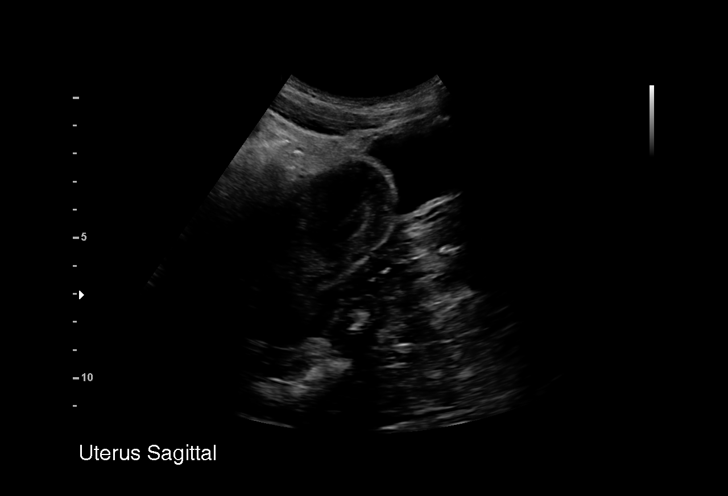
[im 9/61]
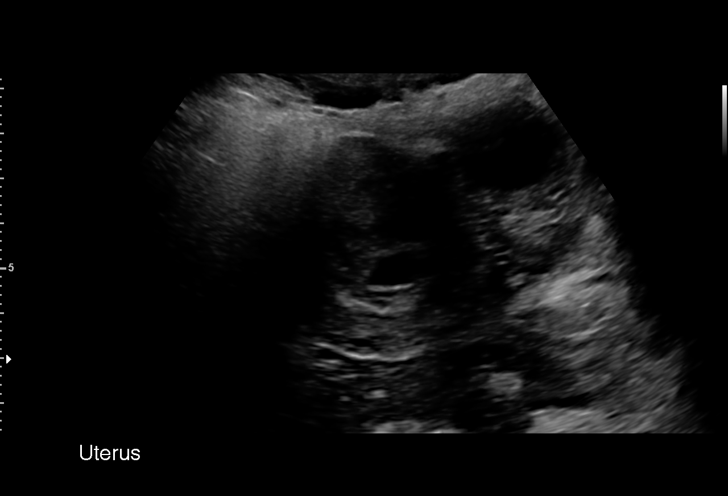
[im 14/61]
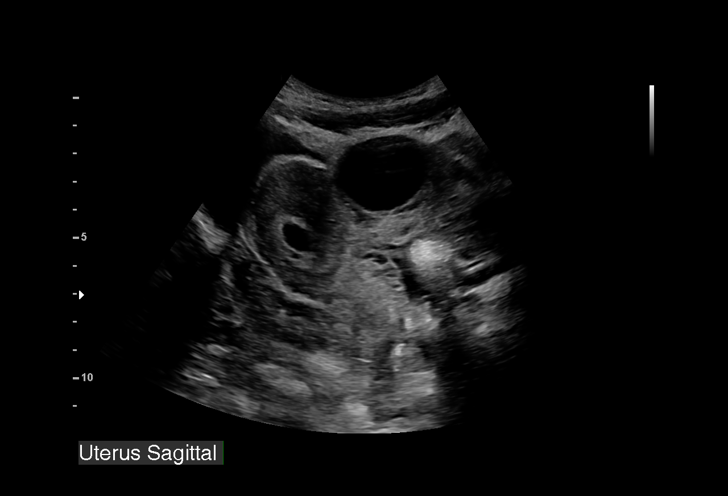
[im 18/61]
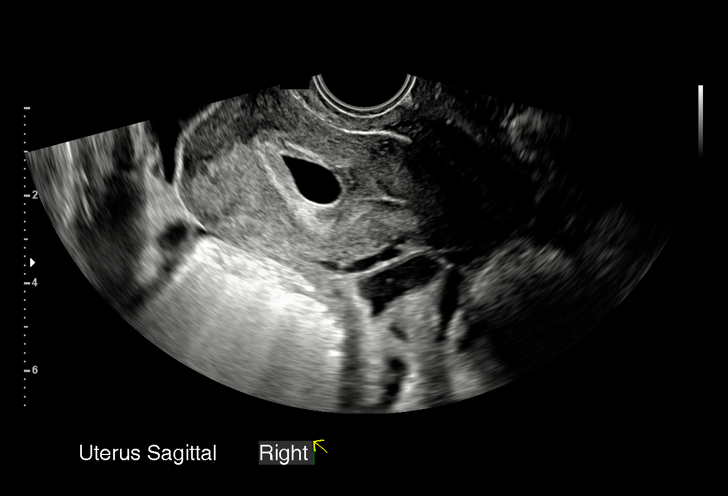
[im 23/61]
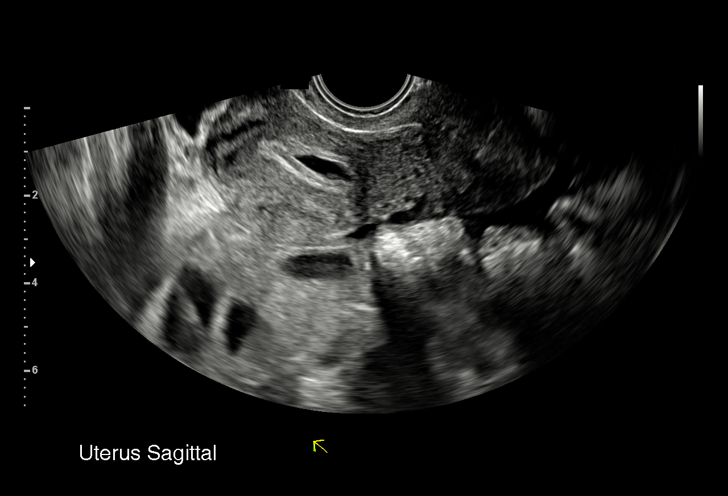
[im 27/61]
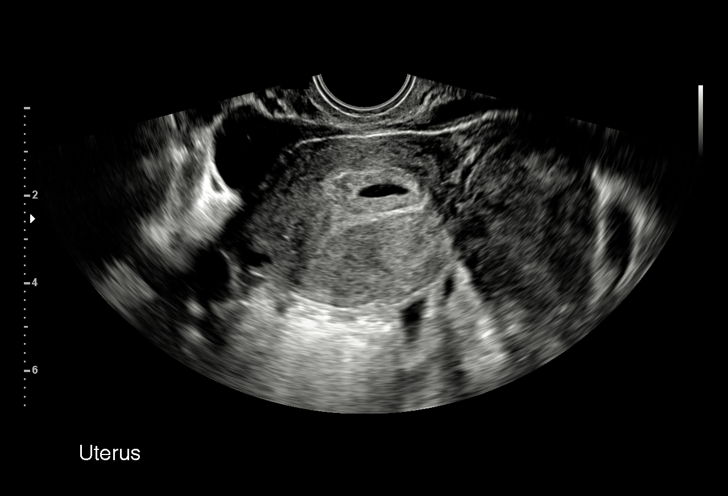
[im 32/61]
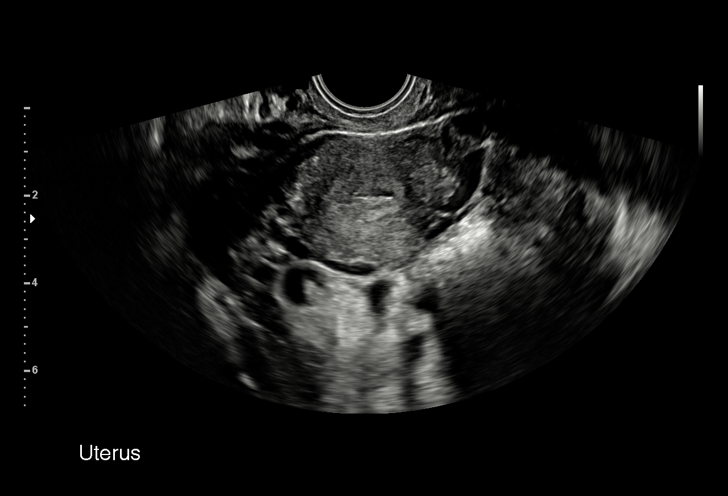
[im 34/61]
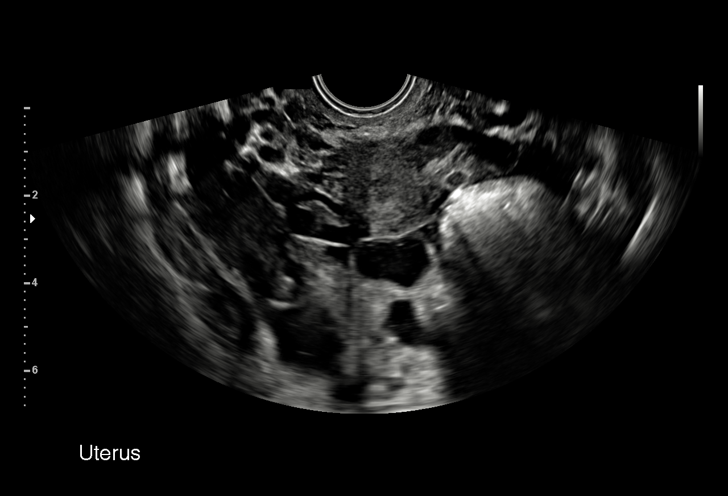
[im 38/61]
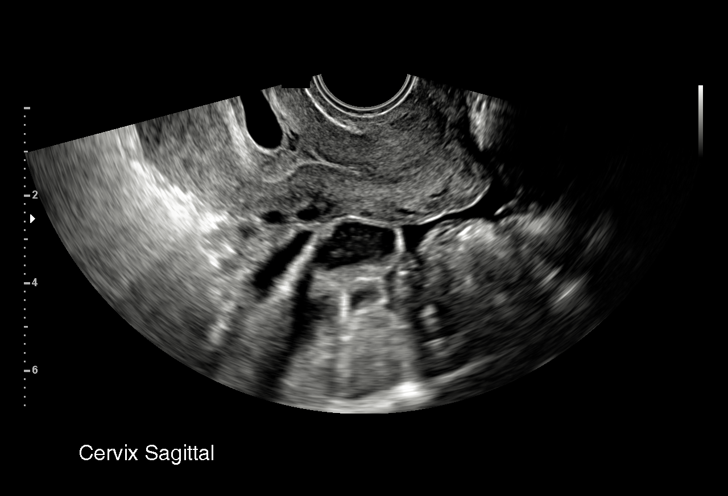
[im 43/61]
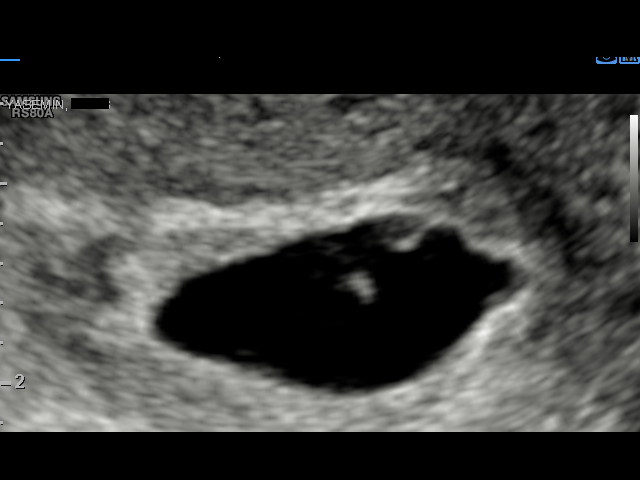
[im 47/61]
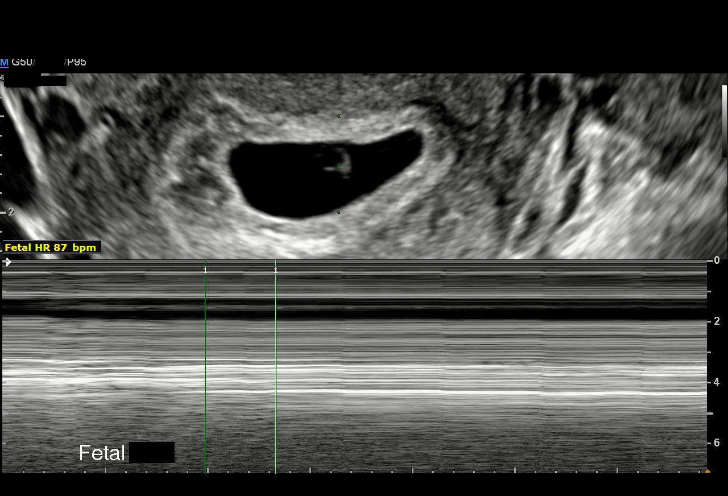
[im 52/61]
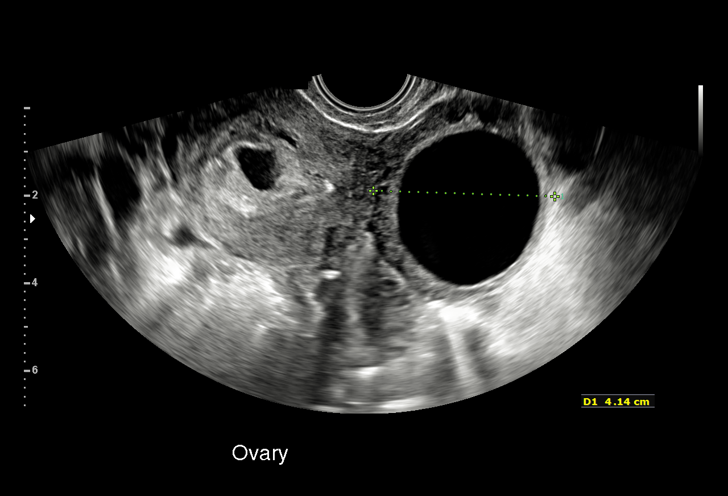
[im 56/61]
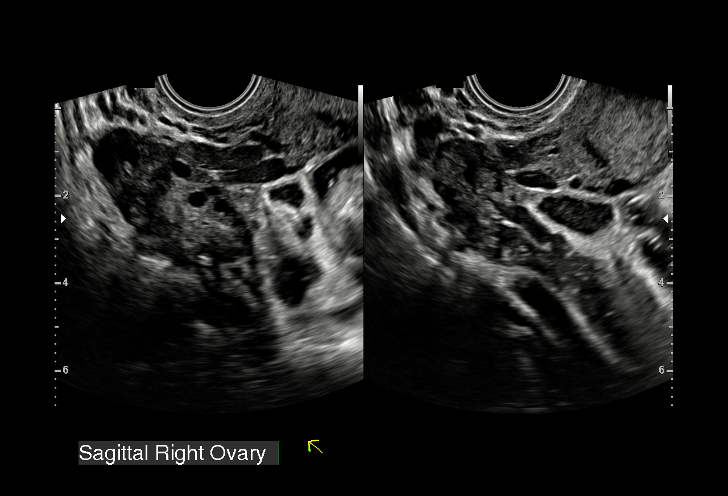
[im 61/61]
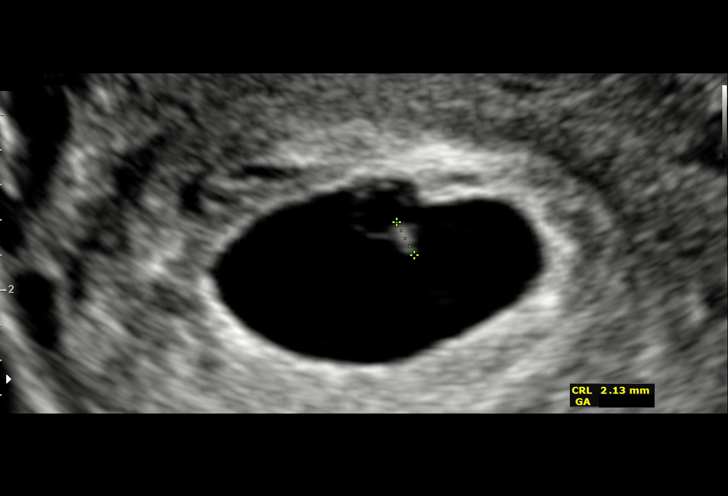

[15 of 28 positions shown; findings below may reference images not displayed]

FINDINGS: Intrauterine gestational sac: Single.

Yolk sac:  Visualized.

Embryo:  Visualized.

Cardiac Activity: Visualized.

Heart Rate: 95 bpm

CRL: 2.1 mm   5 w   5 d                  US EDC: 08/23/2018

Subchorionic hemorrhage:  Small

Maternal uterus/adnexae: Corpus luteum cyst identified left ovary.
Probable trace fluid in the cul-de-sac.
IMPRESSION: Single living intrauterine gestation with crown-rump length
estimating 5 week 5 day gestational age.

## 2020-08-01 ENCOUNTER — Other Ambulatory Visit: Payer: Self-pay

## 2020-08-01 ENCOUNTER — Ambulatory Visit (HOSPITAL_COMMUNITY)
Admission: EM | Admit: 2020-08-01 | Discharge: 2020-08-01 | Disposition: A | Discharge status: Home / Self Care | Payer: Medicaid Other

## 2022-03-19 ENCOUNTER — Ambulatory Visit
Admission: RE | Admit: 2022-03-19 | Discharge: 2022-03-19 | Disposition: A | Discharge status: Home / Self Care | Payer: BC Managed Care – PPO | Source: Ambulatory Visit | Attending: Obstetrics and Gynecology | Admitting: Obstetrics and Gynecology

## 2022-03-19 ENCOUNTER — Other Ambulatory Visit: Payer: Self-pay | Admitting: Obstetrics and Gynecology

## 2022-03-19 DIAGNOSIS — Z87821 Personal history of retained foreign body fully removed: Secondary | ICD-10-CM
